# Patient Record
Sex: Female | Born: 1960 | Race: White | Hispanic: No
Health system: Southern US, Academic
[De-identification: ages and names within clinical notes are randomized; demographics above are authoritative.]

## PROBLEM LIST (undated history)

## (undated) ENCOUNTER — Ambulatory Visit

## (undated) ENCOUNTER — Ambulatory Visit: Attending: Pharmacist | Primary: Pharmacist

## (undated) ENCOUNTER — Encounter: Attending: Internal Medicine | Primary: Internal Medicine

## (undated) ENCOUNTER — Encounter
Payer: MEDICAID | Attending: Student in an Organized Health Care Education/Training Program | Primary: Student in an Organized Health Care Education/Training Program

## (undated) ENCOUNTER — Encounter

## (undated) ENCOUNTER — Inpatient Hospital Stay

## (undated) ENCOUNTER — Ambulatory Visit: Payer: MEDICAID

## (undated) ENCOUNTER — Encounter
Attending: Pharmacist Clinician (PhC)/ Clinical Pharmacy Specialist | Primary: Pharmacist Clinician (PhC)/ Clinical Pharmacy Specialist

## (undated) ENCOUNTER — Telehealth
Attending: Student in an Organized Health Care Education/Training Program | Primary: Student in an Organized Health Care Education/Training Program

## (undated) ENCOUNTER — Telehealth

## (undated) ENCOUNTER — Encounter
Attending: Student in an Organized Health Care Education/Training Program | Primary: Student in an Organized Health Care Education/Training Program

## (undated) ENCOUNTER — Telehealth
Attending: Pharmacist Clinician (PhC)/ Clinical Pharmacy Specialist | Primary: Pharmacist Clinician (PhC)/ Clinical Pharmacy Specialist

## (undated) ENCOUNTER — Telehealth: Attending: Internal Medicine | Primary: Internal Medicine

## (undated) ENCOUNTER — Ambulatory Visit: Attending: Internal Medicine | Primary: Internal Medicine

## (undated) ENCOUNTER — Non-Acute Institutional Stay: Payer: MEDICAID

## (undated) ENCOUNTER — Encounter: Payer: MEDICAID | Attending: Internal Medicine | Primary: Internal Medicine

## (undated) ENCOUNTER — Encounter: Payer: MEDICAID | Attending: Hematology | Primary: Hematology

## (undated) DIAGNOSIS — B1910 Unspecified viral hepatitis B without hepatic coma: Secondary | ICD-10-CM

## (undated) HISTORY — PX: TUBAL LIGATION: SHX77

---

## 2014-03-31 ENCOUNTER — Emergency Department: Payer: Self-pay | Admitting: Emergency Medicine

## 2014-03-31 LAB — CBC
HCT: 47.5 % — ABNORMAL HIGH (ref 35.0–47.0)
HGB: 16.1 g/dL — ABNORMAL HIGH (ref 12.0–16.0)
MCH: 31.6 pg (ref 26.0–34.0)
MCHC: 34 g/dL (ref 32.0–36.0)
MCV: 93 fL (ref 80–100)
PLATELETS: 231 10*3/uL (ref 150–440)
RBC: 5.11 10*6/uL (ref 3.80–5.20)
RDW: 13.4 % (ref 11.5–14.5)
WBC: 15.1 10*3/uL — AB (ref 3.6–11.0)

## 2014-03-31 LAB — COMPREHENSIVE METABOLIC PANEL
ALBUMIN: 3.8 g/dL (ref 3.4–5.0)
ALK PHOS: 157 U/L — AB
Anion Gap: 4 — ABNORMAL LOW (ref 7–16)
BUN: 7 mg/dL (ref 7–18)
Bilirubin,Total: 0.3 mg/dL (ref 0.2–1.0)
CHLORIDE: 108 mmol/L — AB (ref 98–107)
CO2: 30 mmol/L (ref 21–32)
Calcium, Total: 8.6 mg/dL (ref 8.5–10.1)
Creatinine: 0.75 mg/dL (ref 0.60–1.30)
EGFR (African American): >60
EGFR (Non-African Amer.): >60
Glucose: 190 mg/dL — ABNORMAL HIGH (ref 65–99)
Osmolality: 286 (ref 275–301)
Potassium: 3.8 mmol/L (ref 3.5–5.1)
SGOT(AST): 111 U/L — ABNORMAL HIGH (ref 15–37)
SGPT (ALT): 144 U/L — ABNORMAL HIGH (ref 12–78)
SODIUM: 142 mmol/L (ref 136–145)
Total Protein: 9 g/dL — ABNORMAL HIGH (ref 6.4–8.2)

## 2014-03-31 LAB — URINALYSIS, COMPLETE
Bilirubin,UR: NEGATIVE
Glucose,UR: NEGATIVE mg/dL (ref 0–75)
KETONE: NEGATIVE
Nitrite: NEGATIVE
PH: 5 (ref 4.5–8.0)
PROTEIN: NEGATIVE
SPECIFIC GRAVITY: 1.006 (ref 1.003–1.030)
Squamous Epithelial: 5

## 2014-03-31 LAB — DRUG SCREEN, URINE
Amphetamines, Ur Screen: NEGATIVE (ref ?–1000)
BENZODIAZEPINE, UR SCRN: NEGATIVE (ref ?–200)
Barbiturates, Ur Screen: NEGATIVE (ref ?–200)
CANNABINOID 50 NG, UR ~~LOC~~: NEGATIVE (ref ?–50)
COCAINE METABOLITE, UR ~~LOC~~: NEGATIVE (ref ?–300)
MDMA (Ecstasy)Ur Screen: NEGATIVE (ref ?–500)
Methadone, Ur Screen: NEGATIVE (ref ?–300)
Opiate, Ur Screen: NEGATIVE (ref ?–300)
Phencyclidine (PCP) Ur S: NEGATIVE (ref ?–25)
Tricyclic, Ur Screen: NEGATIVE (ref ?–1000)

## 2014-03-31 LAB — ETHANOL
ETHANOL LVL: 239 mg/dL
Ethanol %: 0.239 % — ABNORMAL HIGH (ref 0.000–0.080)

## 2014-03-31 LAB — SALICYLATE LEVEL: Salicylates, Serum: 3 mg/dL — ABNORMAL HIGH

## 2014-03-31 LAB — ACETAMINOPHEN LEVEL

## 2014-04-01 LAB — URINE CULTURE

## 2015-01-06 ENCOUNTER — Emergency Department: Payer: Self-pay | Admitting: Internal Medicine

## 2015-04-02 NOTE — Consult Note (Signed)
PATIENT NAME:  Sherry Bradley, Sherry Bradley MR#:  [id number] DATE OF BIRTH:  01-15-1961  DATE OF CONSULTATION:  03/31/2014  REFERRING PHYSICIAN:   CONSULTING PHYSICIAN:  Audery Amel, MD  IDENTIFYING INFORMATION AND REASON FOR CONSULT: A 54 year old woman brought to the Emergency Room under involuntary petition because of behavior while intoxicated. Consultation for psychiatric disposition.   HISTORY OF PRESENT ILLNESS: Information obtained from the patient and the chart. The commitment petition states that the patient was intoxicated and was talking crazy, fighting with her family members, walking out into the street, talking about how she wanted a car to hit her yesterday. It states that the patient has a long history of substance abuse problems including alcohol and cocaine. It also mentions that she is all right as long as she is not drinking. The patient herself basically says the same thing. She says that she had not been drinking much at all in the last year or so, but that yesterday her niece came to visit and wanted to drink some whiskey, so she obliged her. She admits that she got intoxicated and says that she thinks it has perfectly natural for her to talk about killing herself and act crazy while she is intoxicated. She denies that she actually has any intention of wanting to die. She denies that her mood has been particularly depressed. Denies hopelessness. Endorses positive thoughts about her life and about her future plans. Denies any hallucinations. She denies that she has been abusing any other drugs. Says he has not used any crack cocaine in at least a year.   PAST PSYCHIATRIC HISTORY: She has never been at our facility before, but says that when she lived in [place] she did have a history of a drug abuse problem. She had been hospitalized at times for substance abuse detox. She denies that she has had a history of seizures or delirium tremens. She admits that she used to use crack cocaine, but says  that she has got that under control. She admits that she has cut herself on the arm and did that about 6 months ago, but denies any self-harm since then. Denies any other suicide attempts. She says she has never been hospitalized for anything besides substance abuse and cannot recall ever being prescribed any psychiatric medicine.   SOCIAL HISTORY: Moved up here from [place] a year or two ago and lives with extended family. Takes care of grandchildren at home. Says that when she is drinking she will sometimes get into fights with her family. It does not seem to feel like it is a major problem when she is sober.   PAST MEDICAL HISTORY: Says that she has heartburn and acid reflux, but denies any other significant ongoing medical problems. Denies any history of seizures.   FAMILY HISTORY: The patient states there is a family history of substance abuse.   REVIEW OF SYSTEMS: Currently she states that she has a headache and feels tired. She denies feeling sad and depressed. Denies feeling hopeless. Denies any suicidal thought or wish to die. Denies homicidal ideation. Denies auditory or visual or tactile hallucinations. She denies pulmonary or cardiac or GI problems right now other than that she is not hungry. The rest of the full review of systems is negative.   MENTAL STATUS EXAMINATION: Overweight woman, looks her stated age, interviewed in the Emergency Room. She was lying down on stretcher with her arm over her face. She was passively cooperative with the interview. Made little eye contact. She explained  this because she had a headache. Speech was decreased in total amount, but understandable. Affect blunted, slightly irritable. Mood stated as being fine. Thoughts are lucid with no evidence of delusions or loosening of associations. Denies auditory or visual hallucinations. Denies suicidal or homicidal ideation. The patient could recall 3 out of 3 objects immediately and at 3 minutes. She was alert and  oriented x 4. She had appropriate understanding of her history of a substance abuse problem.   LABORATORY RESULTS: Chemistry panel shows a negative urine drug screen. Urinalysis does look like she probably has a urinary tract infection. White count elevated at 15, hematocrit elevated at 47.5, platelet count normal. ALT elevated at 144, AST elevated 199, total protein elevated at 9, blood glucose was 190. Alcohol level was 239 as of 2:00 in the morning today. Salicylates only slightly elevated at 3. Acetaminophen negative.   VITAL SIGNS: Current blood pressure 98/53, respirations 18, pulse 77, temperature 98. She did not appear to be having any kind of tremor when I saw her.   ASSESSMENT: This is a 54 year old woman with alcohol dependence and cocaine dependence, recent relapse into alcohol. Currently having minimal or no signs or symptoms of alcohol withdrawal. While intoxicated, made statements about suicidality, but did not actually harm herself. Now that she is sober, she completely denies any suicidal ideation and denies any homicidal ideation. She does not appear to be psychotic or delirious. The patient may or may not need physically monitored detox, but at this point, it seems likely that she could benefit from it, but it is probably not life or death.   TREATMENT PLAN: The patient was educated about alcohol abuse. She was reminded that heavy drinking causes people to do or say things that they may regret. She expresses a complete understanding of this and says that she regrets having drunk whiskey yesterday and intends to go back to not drinking again. Currently, she does not appear to be psychotic and does not appear to be expressing acute dangerousness to herself.   I suggest that we refer her to the alcohol and drug abuse treatment center in [place]. If however, there is no bed available, we will have to reconsider. I do not think she needs psychiatric hospitalization at our facility.    DIAGNOSIS, PRINCIPAL AND PRIMARY:  AXIS I: Alcohol dependence.  SECONDARY DIAGNOSES:  AXIS I:  1.  Cocaine dependence, in remission.  2.  Substance-induced mood disorder, resolved.  AXIS II: Deferred.  AXIS III: Gastric reflux symptoms.  AXIS IV: Moderate from conflict with her family.  AXIS V: Functioning at time of evaluation is 47.  ____________________________ Audery AmelJohn T. Mahlani Berninger, MD jtc:aw D: 03/31/2014 13:03:06 ET T: 03/31/2014 13:11:37 ET JOB#: [id number]  cc: Audery AmelJohn T. Wrenna Saks, MD, <Dictator> Audery AmelJOHN T Raymar Joiner MD ELECTRONICALLY SIGNED 03/31/2014 14:00

## 2015-07-11 ENCOUNTER — Encounter: Payer: Self-pay | Admitting: Emergency Medicine

## 2015-07-11 ENCOUNTER — Emergency Department
Admission: EM | Admit: 2015-07-11 | Discharge: 2015-07-11 | Disposition: A | Discharge status: Home / Self Care | Payer: Self-pay | Attending: Emergency Medicine | Admitting: Emergency Medicine

## 2015-07-11 ENCOUNTER — Emergency Department: Payer: Self-pay

## 2015-07-11 DIAGNOSIS — Y998 Other external cause status: Secondary | ICD-10-CM | POA: Insufficient documentation

## 2015-07-11 DIAGNOSIS — Z720 Tobacco use: Secondary | ICD-10-CM | POA: Insufficient documentation

## 2015-07-11 DIAGNOSIS — W182XXA Fall in (into) shower or empty bathtub, initial encounter: Secondary | ICD-10-CM | POA: Insufficient documentation

## 2015-07-11 DIAGNOSIS — Y9389 Activity, other specified: Secondary | ICD-10-CM | POA: Insufficient documentation

## 2015-07-11 DIAGNOSIS — Y92091 Bathroom in other non-institutional residence as the place of occurrence of the external cause: Secondary | ICD-10-CM | POA: Insufficient documentation

## 2015-07-11 DIAGNOSIS — S52502A Unspecified fracture of the lower end of left radius, initial encounter for closed fracture: Secondary | ICD-10-CM | POA: Insufficient documentation

## 2015-07-11 MED ORDER — OXYCODONE-ACETAMINOPHEN 5-325 MG PO TABS: 1.0000 | ORAL_TABLET | Freq: Four times a day (QID) | ORAL | Status: DC | PRN

## 2015-07-11 MED ORDER — OXYCODONE-ACETAMINOPHEN 5-325 MG PO TABS
2.0000 | ORAL_TABLET | Freq: Once | ORAL | Status: AC
  Administered 2015-07-11: 2 via ORAL
  Filled 2015-07-11: qty 2

## 2015-07-11 NOTE — ED Provider Notes (Addendum)
[facility] Emergency Department Provider Note     Time seen: ----------------------------------------- 2:59 PM on 07/11/2015 -----------------------------------------    I have reviewed the triage vital signs and the nursing notes.   HISTORY  Chief Complaint Wrist Pain    HPI Sherry Bradley is a 54 y.o. female who presents ER stating she fell out of the shower. She reports she fell and landed on her left wrist. Patient with redness and ice pack noted to the left wrist. Patient states that when it initially occurred it was causing her a lot of pain and she had someone pulled one out. She states she felt it pop 4 times after someone pulled on it. Denies any other injuries from the fall.   History reviewed. No pertinent past medical history.  There are no active problems to display for this patient.   History reviewed. No pertinent past surgical history.  Allergies Demerol  Social History History  Substance Use Topics  . Smoking status: Current Every Day Smoker  . Smokeless tobacco: Not on file  . Alcohol Use: No    Review of Systems Constitutional: Negative for fever. Musculoskeletal: Positive for left wrist pain and deformity Skin: There is erythema noted to the dorsum of the left wrist Neurological: Negative for headaches, focal weakness or numbness.  ___________________________________________   PHYSICAL EXAM:  VITAL SIGNS: ED Triage Vitals  Enc Vitals Group     BP 07/11/15 1410 139/69 mmHg     Pulse Rate 07/11/15 1410 80     Resp 07/11/15 1410 18     Temp 07/11/15 1410 98.2 F (36.8 C)     Temp Source 07/11/15 1410 Oral     SpO2 07/11/15 1410 94 %     Weight 07/11/15 1410 223 lb (101.152 kg)     Height 07/11/15 1410  (1.549 m)     Head Cir --      Peak Flow --      Pain Score 07/11/15 1420 10     Pain Loc --      Pain Edu? --      Excl. in GC? --     Constitutional: Alert and oriented. Well appearing and in no  distress. Musculoskeletal: Pain and swelling noted to the left wrist, no gross deformities. Neurologic:  No neurologic deficits are noted in the left hand Skin:  Erythema noted to the dorsum of the left wrist ____________________________________________  ED COURSE:  Pertinent labs & imaging results that were available during my care of the patient were reviewed by me and considered in my medical decision making (see chart for details). Patient is in no acute distress, will need x-rays and reevaluation. ____________________________________________  RADIOLOGY Images were viewed by me  Distal radius fracture IMPRESSION: Comminuted impaction fracture of the distal left radius with intra-articular extension with the distal radial articular surface in neutral to slight dorsal angulation.  Fracture of the ulnar styloid. ____________________________________________  FINAL ASSESSMENT AND PLAN   acute left distal radius fracture  Plan: Patient with labs and imaging as dictated above. Velcro wrist brace is applied, she's given Percocet for pain. She will be referred to orthopedics for close follow-up.   Emily Filbert, MD   Emily Filbert, MD 07/11/15 1511  Emily Filbert, MD 07/11/15 [phone]

## 2015-07-11 NOTE — ED Notes (Signed)
Pt reports she fell out of shower, reports fell and landed on left wrist. Pt with redness and icepack to left wrist.

## 2015-07-11 NOTE — ED Notes (Signed)
Pt arrived to ED with left wrist swelling and bruising. Pt reports pain with movement and to the touch.

## 2015-07-11 NOTE — Discharge Instructions (Signed)
Radial Fracture °You have a broken bone (fracture) of the forearm. This is the part of your arm between the elbow and your wrist. Your forearm is made up of two bones. These are the radius and ulna. Your fracture is in the radial shaft. This is the bone in your forearm located on the thumb side. A cast or splint is used to protect and keep your injured bone from moving. The cast or splint will be on generally for about 5 to 6 weeks, with individual variations. °HOME CARE INSTRUCTIONS  °· Keep the injured part elevated while sitting or lying down. Keep the injury above the level of your heart (the center of the chest). This will decrease swelling and pain. °· Apply ice to the injury for 15-20 minutes, 03-04 times per day while awake, for 2 days. Put the ice in a plastic bag and place a towel between the bag of ice and your cast or splint. °· Move your fingers to avoid stiffness and minimize swelling. °· If you have a plaster or fiberglass cast: °¨ Do not try to scratch the skin under the cast using sharp or pointed objects. °¨ Check the skin around the cast every day. You may put lotion on any red or sore areas. °¨ Keep your cast dry and clean. °· If you have a plaster splint: °¨ Wear the splint as directed. °¨ You may loosen the elastic around the splint if your fingers become numb, tingle, or turn cold or blue. °¨ Do not put pressure on any part of your cast or splint. It may break. Rest your cast only on a pillow for the first 24 hours until it is fully hardened. °· Your cast or splint can be protected during bathing with a plastic bag. Do not lower the cast or splint into water. °· Only take over-the-counter or prescription medicines for pain, discomfort, or fever as directed by your caregiver. °SEEK IMMEDIATE MEDICAL CARE IF:  °· Your cast gets damaged or breaks. °· You have more severe pain or swelling than you did before getting the cast. °· You have severe pain when stretching your fingers. °· There is a bad  smell, new stains and/or pus-like (purulent) drainage coming from under the cast. °· Your fingers or hand turn pale or blue and become cold or your loose feeling. °Document Released: 05/09/2006 Document Revised: 02/18/2012 Document Reviewed: 08/05/2006 °Exit[employer]® Patient Information ©2015 [employer]. This information is not intended to replace advice given to you by your health care provider. Make sure you discuss any questions you have with your health care provider. ° °

## 2016-05-22 ENCOUNTER — Emergency Department
Admission: EM | Admit: 2016-05-22 | Discharge: 2016-05-22 | Disposition: A | Discharge status: Home / Self Care | Payer: Self-pay | Attending: Emergency Medicine | Admitting: Emergency Medicine

## 2016-05-22 ENCOUNTER — Encounter: Payer: Self-pay | Admitting: Emergency Medicine

## 2016-05-22 ENCOUNTER — Emergency Department: Payer: Self-pay

## 2016-05-22 DIAGNOSIS — Y9389 Activity, other specified: Secondary | ICD-10-CM | POA: Insufficient documentation

## 2016-05-22 DIAGNOSIS — Y999 Unspecified external cause status: Secondary | ICD-10-CM | POA: Insufficient documentation

## 2016-05-22 DIAGNOSIS — S20211A Contusion of right front wall of thorax, initial encounter: Secondary | ICD-10-CM | POA: Insufficient documentation

## 2016-05-22 DIAGNOSIS — F17200 Nicotine dependence, unspecified, uncomplicated: Secondary | ICD-10-CM | POA: Insufficient documentation

## 2016-05-22 DIAGNOSIS — W050XXA Fall from non-moving wheelchair, initial encounter: Secondary | ICD-10-CM | POA: Insufficient documentation

## 2016-05-22 DIAGNOSIS — Y929 Unspecified place or not applicable: Secondary | ICD-10-CM | POA: Insufficient documentation

## 2016-05-22 MED ORDER — OXYCODONE-ACETAMINOPHEN 5-325 MG PO TABS: 1.0000 | ORAL_TABLET | ORAL | Status: AC | PRN

## 2016-05-22 MED ORDER — IBUPROFEN 800 MG PO TABS: 800.0000 mg | ORAL_TABLET | Freq: Three times a day (TID) | ORAL | Status: AC | PRN

## 2016-05-22 NOTE — Discharge Instructions (Signed)
Cryotherapy °Cryotherapy means treatment with cold. Ice or gel packs can be used to reduce both pain and swelling. Ice is the most helpful within the first 24 to 48 hours after an injury or flare-up from overusing a muscle or joint. Sprains, strains, spasms, burning pain, shooting pain, and aches can all be eased with ice. Ice can also be used when recovering from surgery. Ice is effective, has very few side effects, and is safe for most people to use. °PRECAUTIONS  °Ice is not a safe treatment option for people with: °· Raynaud phenomenon. This is a condition affecting small blood vessels in the extremities. Exposure to cold may cause your problems to return. °· Cold hypersensitivity. There are many forms of cold hypersensitivity, including: °· Cold urticaria. Red, itchy hives appear on the skin when the tissues begin to warm after being iced. °· Cold erythema. This is a red, itchy rash caused by exposure to cold. °· Cold hemoglobinuria. Red blood cells break down when the tissues begin to warm after being iced. The hemoglobin that carry oxygen are passed into the urine because they cannot combine with blood proteins fast enough. °· Numbness or altered sensitivity in the area being iced. °If you have any of the following conditions, do not use ice until you have discussed cryotherapy with your caregiver: °· Heart conditions, such as arrhythmia, angina, or chronic heart disease. °· High blood pressure. °· Healing wounds or open skin in the area being iced. °· Current infections. °· Rheumatoid arthritis. °· Poor circulation. °· Diabetes. °Ice slows the blood flow in the region it is applied. This is beneficial when trying to stop inflamed tissues from spreading irritating chemicals to surrounding tissues. However, if you expose your skin to cold temperatures for too long or without the proper protection, you can damage your skin or nerves. Watch for signs of skin damage due to cold. °HOME CARE INSTRUCTIONS °Follow  these tips to use ice and cold packs safely. °· Place a dry or damp towel between the ice and skin. A damp towel will cool the skin more quickly, so you may need to shorten the time that the ice is used. °· For a more rapid response, add gentle compression to the ice. °· Ice for no more than 10 to 20 minutes at a time. The bonier the area you are icing, the less time it will take to get the benefits of ice. °· Check your skin after 5 minutes to make sure there are no signs of a poor response to cold or skin damage. °· Rest 20 minutes or more between uses. °· Once your skin is numb, you can end your treatment. You can test numbness by very lightly touching your skin. The touch should be so light that you do not see the skin dimple from the pressure of your fingertip. When using ice, most people will feel these normal sensations in this order: cold, burning, aching, and numbness. °· Do not use ice on someone who cannot communicate their responses to pain, such as small children or people with dementia. °HOW TO MAKE AN ICE PACK °Ice packs are the most common way to use ice therapy. Other methods include ice massage, ice baths, and cryosprays. Muscle creams that cause a cold, tingly feeling do not offer the same benefits that ice offers and should not be used as a substitute unless recommended by your caregiver. °To make an ice pack, do one of the following: °· Place crushed ice or a   bag of frozen vegetables in a sealable plastic bag. Squeeze out the excess air. Place this bag inside another plastic bag. Slide the bag into a pillowcase or place a damp towel between your skin and the bag.  Mix 3 parts water with 1 part rubbing alcohol. Freeze the mixture in a sealable plastic bag. When you remove the mixture from the freezer, it will be slushy. Squeeze out the excess air. Place this bag inside another plastic bag. Slide the bag into a pillowcase or place a damp towel between your skin and the bag. SEEK MEDICAL CARE  IF:  You develop white spots on your skin. This may give the skin a blotchy (mottled) appearance.  Your skin turns blue or pale.  Your skin becomes waxy or hard.  Your swelling gets worse. MAKE SURE YOU:   Understand these instructions.  Will watch your condition.  Will get help right away if you are not doing well or get worse.   This information is not intended to replace advice given to you by your health care provider. Make sure you discuss any questions you have with your health care provider.   Document Released: 07/23/2011 Document Revised: 12/17/2014 Document Reviewed: 07/23/2011 Elsevier Interactive Patient Education 2016 [employer]  Rib Contusion A rib contusion is a deep bruise on your rib area. Contusions are the result of a blunt trauma that causes bleeding and injury to the tissues under the skin. A rib contusion may involve bruising of the ribs and of the skin and muscles in the area. The skin overlying the contusion may turn blue, purple, or yellow. Minor injuries will give you a painless contusion, but more severe contusions may stay painful and swollen for a few weeks. CAUSES  A contusion is usually caused by a blow, trauma, or direct force to an area of the body. This often occurs while playing contact sports. SYMPTOMS  Swelling and redness of the injured area.  Discoloration of the injured area.  Tenderness and soreness of the injured area.  Pain with or without movement. DIAGNOSIS  The diagnosis can be made by taking a medical history and performing a physical exam. An X-ray, CT scan, or MRI may be needed to determine if there were any associated injuries, such as broken bones (fractures) or internal injuries. TREATMENT  Often, the best treatment for a rib contusion is rest. Icing or applying cold compresses to the injured area may help reduce swelling and inflammation. Deep breathing exercises may be recommended to reduce the risk of partial lung  collapse and pneumonia. Over-the-counter or prescription medicines may also be recommended for pain control. HOME CARE INSTRUCTIONS   Apply ice to the injured area:  Put ice in a plastic bag.  Place a towel between your skin and the bag.  Leave the ice on for 20 minutes, 2-3 times per day.  Take medicines only as directed by your health care provider.  Rest the injured area. Avoid strenuous activity and any activities or movements that cause pain. Be careful during activities and avoid bumping the injured area.  Perform deep-breathing exercises as directed by your health care provider.  Do not lift anything that is heavier than 5 lb (2.3 kg) until your health care provider approves.  Do not use any tobacco products, including cigarettes, chewing tobacco, or electronic cigarettes. If you need help quitting, ask your health care provider. SEEK MEDICAL CARE IF:   You have increased bruising or swelling.  You have pain that is  not controlled with treatment.  You have a fever. SEEK IMMEDIATE MEDICAL CARE IF:   You have difficulty breathing or shortness of breath.  You develop a continual cough, or you cough up thick or bloody sputum.  You feel sick to your stomach (nauseous), you throw up (vomit), or you have abdominal pain.   This information is not intended to replace advice given to you by your health care provider. Make sure you discuss any questions you have with your health care provider.   Document Released: 08/21/2001 Document Revised: 12/17/2014 Document Reviewed: 09/07/2014 Elsevier Interactive Patient Education [employer]

## 2016-05-22 NOTE — ED Provider Notes (Signed)
[facility] Emergency Department Provider Note  ____________________________________________  Time seen: Approximately 12:46 PM  I have reviewed the triage vital signs and the nursing notes.   HISTORY  Chief Complaint Fall    HPI Sherry Bradley is a 55 y.o. female is here for evaluation after falling out of a wheelchair and landing on her right ribs. Patient is complaining of right lateral and posterior rib pain. Denies any difficulty breathing denies any shortness of breath.   History reviewed. No pertinent past medical history.  There are no active problems to display for this patient.   History reviewed. No pertinent past surgical history.  Current Outpatient Rx  Name  Route  Sig  Dispense  Refill  . ibuprofen (ADVIL,MOTRIN) 800 MG tablet   Oral   Take 1 tablet (800 mg total) by mouth every 8 (eight) hours as needed.   30 tablet   0   . oxyCODONE-acetaminophen (ROXICET) 5-325 MG tablet   Oral   Take 1-2 tablets by mouth every 4 (four) hours as needed for severe pain.   15 tablet   0     Allergies Demerol  No family history on file.  Social History Social History  Substance Use Topics  . Smoking status: Current Every Day Smoker  . Smokeless tobacco: None  . Alcohol Use: No    Review of Systems Constitutional: No fever/chills Cardiovascular: Denies chest pain. Respiratory: Denies shortness of breath. Gastrointestinal: No abdominal pain.  No nausea, no vomiting.  No diarrhea.  No constipation. Musculoskeletal: Positive for right rib pain. Skin: Negative for rash. Neurological: Negative for headaches, focal weakness or numbness.  10-point ROS otherwise negative.  ____________________________________________   PHYSICAL EXAM:  VITAL SIGNS: ED Triage Vitals  Enc Vitals Group     BP 05/22/16 1236 144/68 mmHg     Pulse Rate 05/22/16 1236 93     Resp 05/22/16 1236 16     Temp 05/22/16 1236 98.2 F (36.8 C)     Temp Source  05/22/16 1236 Oral     SpO2 05/22/16 1236 95 %     Weight 05/22/16 1236 184 lb (83.462 kg)     Height 05/22/16 1236  (1.575 m)     Head Cir --      Peak Flow --      Pain Score 05/22/16 1235 10     Pain Loc --      Pain Edu? --      Excl. in GC? --     Constitutional: Alert and oriented. Well appearing and in no acute distress. Cardiovascular: Normal rate, regular rhythm. Grossly normal heart sounds.  Good peripheral circulation. Respiratory: Normal respiratory effort.  No retractions. Lungs CTAB. Gastrointestinal: Soft and nontender. No distention. No abdominal bruits. No CVA tenderness. Musculoskeletal: No lower extremity tenderness nor edema.  No joint effusions. Right ribs and point tenderness no ecchymosis or bruising noted. Neurologic:  Normal speech and language. No gross focal neurologic deficits are appreciated. No gait instability. Skin:  Skin is warm, dry and intact. No rash noted. Psychiatric: Mood and affect are normal. Speech and behavior are normal.  ____________________________________________   LABS (all labs ordered are listed, but only abnormal results are displayed)  Labs Reviewed - No data to display ____________________________________________  EKG   ____________________________________________  RADIOLOGY   ____________________________________________   PROCEDURES  Procedure(s) performed: None  Critical Care performed: No  ____________________________________________   INITIAL IMPRESSION / ASSESSMENT AND PLAN / ED COURSE  Pertinent labs &  imaging results that were available during my care of the patient were reviewed by me and considered in my medical decision making (see chart for details).  Acute right rib contusion. Rx given for ibuprofen 800 mg 3 times a day and Percocet 5/325. She is to follow-up with PCP or return to ER with worsening symptomology. [place] controlled substance reporting system reviewed prior to  administration of medications. ____________________________________________   FINAL CLINICAL IMPRESSION(S) / ED DIAGNOSES  Final diagnoses:  Rib contusion, right, initial encounter     This chart was dictated using voice recognition software/Dragon. Despite best efforts to proofread, errors can occur which can change the meaning. Any change was purely unintentional.   Evangeline Dakinharles M Beers, PA-C 05/22/16 1343  Rockne MenghiniAnne-Caroline Norman, MD 05/22/16 1423

## 2016-05-22 NOTE — ED Notes (Signed)
After swimming yesterday, when to get out of the pool and sit in chair, chair toppled and patient fell.  C/o right posterior rib pain.

## 2016-06-17 ENCOUNTER — Emergency Department
Admission: EM | Admit: 2016-06-17 | Discharge: 2016-06-18 | Disposition: A | Discharge status: Home / Self Care | Payer: Self-pay | Attending: Emergency Medicine | Admitting: Emergency Medicine

## 2016-06-17 ENCOUNTER — Encounter: Payer: Self-pay | Admitting: *Deleted

## 2016-06-17 ENCOUNTER — Emergency Department: Payer: Self-pay

## 2016-06-17 DIAGNOSIS — J40 Bronchitis, not specified as acute or chronic: Secondary | ICD-10-CM | POA: Insufficient documentation

## 2016-06-17 DIAGNOSIS — R0789 Other chest pain: Secondary | ICD-10-CM

## 2016-06-17 DIAGNOSIS — R0600 Dyspnea, unspecified: Secondary | ICD-10-CM

## 2016-06-17 DIAGNOSIS — F17210 Nicotine dependence, cigarettes, uncomplicated: Secondary | ICD-10-CM | POA: Insufficient documentation

## 2016-06-17 HISTORY — DX: Unspecified viral hepatitis B without hepatic coma: B19.10

## 2016-06-17 LAB — BASIC METABOLIC PANEL
Anion gap: 7 (ref 5–15)
BUN: 10 mg/dL (ref 6–20)
CHLORIDE: 101 mmol/L (ref 101–111)
CO2: 29 mmol/L (ref 22–32)
Calcium: 9.4 mg/dL (ref 8.9–10.3)
Creatinine, Ser: 0.61 mg/dL (ref 0.44–1.00)
GFR calc Af Amer: >60 mL/min (ref >60)
GFR calc non Af Amer: >60 mL/min (ref >60)
Glucose, Bld: 164 mg/dL — ABNORMAL HIGH (ref 65–99)
POTASSIUM: 3.8 mmol/L (ref 3.5–5.1)
SODIUM: 137 mmol/L (ref 135–145)

## 2016-06-17 LAB — CBC
HEMATOCRIT: 48.3 % — AB (ref 35.0–47.0)
Hemoglobin: 16.8 g/dL — ABNORMAL HIGH (ref 12.0–16.0)
MCH: 31.6 pg (ref 26.0–34.0)
MCHC: 34.8 g/dL (ref 32.0–36.0)
MCV: 90.9 fL (ref 80.0–100.0)
Platelets: 121 10*3/uL — ABNORMAL LOW (ref 150–440)
RBC: 5.32 MIL/uL — AB (ref 3.80–5.20)
RDW: 13.9 % (ref 11.5–14.5)
WBC: 12.8 10*3/uL — AB (ref 3.6–11.0)

## 2016-06-17 LAB — TROPONIN I: Troponin I: <0.03 ng/mL (ref <0.03)

## 2016-06-17 NOTE — ED Notes (Signed)
Patient with right rib pain. States coughing and felt something pop while she was coughing. States she injured herself a month ago and today it seems worse.

## 2016-06-18 ENCOUNTER — Emergency Department: Payer: Self-pay

## 2016-06-18 LAB — FIBRIN DERIVATIVES D-DIMER (ARMC ONLY): Fibrin derivatives D-dimer (ARMC): 512 — ABNORMAL HIGH (ref 0–499)

## 2016-06-18 LAB — TROPONIN I: Troponin I: <0.03 ng/mL (ref <0.03)

## 2016-06-18 MED ORDER — PREDNISONE 20 MG PO TABS: 60.0000 mg | ORAL_TABLET | Freq: Every day | ORAL | Status: AC

## 2016-06-18 MED ORDER — TRAMADOL HCL 50 MG PO TABS: 50.0000 mg | ORAL_TABLET | Freq: Four times a day (QID) | ORAL | Status: AC | PRN

## 2016-06-18 MED ORDER — ALBUTEROL SULFATE HFA 108 (90 BASE) MCG/ACT IN AERS: 2.0000 | INHALATION_SPRAY | Freq: Four times a day (QID) | RESPIRATORY_TRACT | Status: AC | PRN

## 2016-06-18 MED ORDER — LIDOCAINE 5 % EX PTCH
1.0000 | MEDICATED_PATCH | CUTANEOUS | Status: DC
  Administered 2016-06-18: 1 via TRANSDERMAL
  Filled 2016-06-18: qty 1

## 2016-06-18 MED ORDER — AZITHROMYCIN 250 MG PO TABS: ORAL_TABLET | ORAL | Status: AC

## 2016-06-18 MED ORDER — IOPAMIDOL (ISOVUE-370) INJECTION 76%
75.0000 mL | Freq: Once | INTRAVENOUS | Status: AC | PRN
  Administered 2016-06-18: 75 mL via INTRAVENOUS

## 2016-06-18 MED ORDER — KETOROLAC TROMETHAMINE 60 MG/2ML IM SOLN
60.0000 mg | Freq: Once | INTRAMUSCULAR | Status: AC
  Administered 2016-06-18: 60 mg via INTRAMUSCULAR
  Filled 2016-06-18: qty 2

## 2016-06-18 MED ORDER — IPRATROPIUM-ALBUTEROL 0.5-2.5 (3) MG/3ML IN SOLN
3.0000 mL | Freq: Once | RESPIRATORY_TRACT | Status: AC
  Administered 2016-06-18: 3 mL via RESPIRATORY_TRACT
  Filled 2016-06-18: qty 3

## 2016-06-18 MED ORDER — LIDOCAINE 5 % EX PTCH: 1.0000 | MEDICATED_PATCH | Freq: Two times a day (BID) | CUTANEOUS | Status: AC

## 2016-06-18 MED ORDER — PREDNISONE 20 MG PO TABS
60.0000 mg | ORAL_TABLET | Freq: Once | ORAL | Status: AC
  Administered 2016-06-18: 60 mg via ORAL
  Filled 2016-06-18: qty 3

## 2016-06-18 NOTE — ED Provider Notes (Signed)
[facility] Emergency Department Provider Note   ____________________________________________  Time seen: Approximately 0001 AM  I have reviewed the triage vital signs and the nursing notes.   HISTORY  Chief Complaint Rib Injury    HPI Sherry Bradley is a 55 y.o. female who comes into the hospital today with some right-sided chest pain. The patient reports that she injured herself falling last month and since then developed a chest cold. She reports that she's been coughing and sneezing and 2 days ago felt something pop in the right side of her chest. The patient reports that she was told she had a contusion from her fall previously but has been coughing up some green. She also reports that it hurts when she breathes. The patient does not have a primary care physician but reports her pain as a 10 out of 10 in intensity. The patient is been taking ibuprofen and BC powders but she reports is not helping. She last took any ibuprofen 3 days ago. She reports that she is feeling short of breath than she had Percocet but it makes her sick. The patient is here for evaluation.   Past Medical History  Diagnosis Date  . 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   Past Surgical History  Procedure Laterality Date  . Tubal ligation      Current Outpatient Rx  Name  Route  Sig  Dispense  Refill  . albuterol (PROVENTIL HFA;VENTOLIN HFA) 108 (90 Base) MCG/ACT inhaler   Inhalation   Inhale 2 puffs into the lungs every 6 (six) hours as needed.   1 Inhaler   0   . azithromycin (ZITHROMAX Z-PAK) 250 MG tablet      Take 2 tablets (500 mg) on  Day 1,  followed by 1 tablet (250 mg) once daily on Days 2 through 5.   6 each   0   . ibuprofen (ADVIL,MOTRIN) 800 MG tablet   Oral   Take 1 tablet (800 mg total) by mouth every 8 (eight) hours as needed.   30 tablet   0   . lidocaine (LIDODERM) 5 %   Transdermal   Place 1 patch onto  the skin every 12 (twelve) hours. Remove & Discard patch within 12 hours or as directed by MD   10 patch   0   . oxyCODONE-acetaminophen (ROXICET) 5-325 MG tablet   Oral   Take 1-2 tablets by mouth every 4 (four) hours as needed for severe pain.   15 tablet   0   . predniSONE (DELTASONE) 20 MG tablet   Oral   Take 3 tablets (60 mg total) by mouth daily.   12 tablet   0   . traMADol (ULTRAM) 50 MG tablet   Oral   Take 1 tablet (50 mg total) by mouth every 6 (six) hours as needed.   12 tablet   0     Allergies Demerol  History reviewed. No pertinent family history.  Social History Social History  Substance Use Topics  . Smoking status: Current Every Day Smoker    Types: Cigarettes  . Smokeless tobacco: Never Used  . Alcohol Use: Yes     Comment: rarely    Review of Systems Constitutional: No fever/chills Eyes: No visual changes. ENT: No sore throat. Cardiovascular:Right-sided chest pain. Respiratory:  shortness of breath. Gastrointestinal: No abdominal pain.  No nausea, no vomiting.  No diarrhea.  No  constipation. Genitourinary: Negative for dysuria. Musculoskeletal: Negative for back pain. Skin: Negative for rash. Neurological: Negative for headaches, focal weakness or numbness.  10-point ROS otherwise negative.  ____________________________________________   PHYSICAL EXAM:  VITAL SIGNS: ED Triage Vitals  Enc Vitals Group     BP 06/17/16 2247 150/88 mmHg     Pulse Rate 06/17/16 2247 94     Resp 06/17/16 2247 24     Temp 06/17/16 2247 98.2 F (36.8 C)     Temp Source 06/17/16 2247 Oral     SpO2 06/17/16 2247 96 %     Weight 06/17/16 2245 184 lb (83.462 kg)     Height 06/17/16 2245 5\' 1"  (1.549 m)     Head Cir --      Peak Flow --      Pain Score 06/17/16 2248 10     Pain Loc --      Pain Edu? --      Excl. in GC? --     Constitutional: Alert and oriented. Well appearing and in Mild distress. Eyes: Conjunctivae are normal. PERRL.  EOMI. Head: Atraumatic. Nose: No congestion/rhinnorhea. Mouth/Throat: Mucous membranes are moist.  Oropharynx non-erythematous. Cardiovascular: Normal rate, regular rhythm. Grossly normal heart sounds.  Good peripheral circulation. Respiratory: Normal respiratory effort.  No retractions. Expiratory wheezing in lung fields, right sided chest tenderness to palpation especially under her right breast Gastrointestinal: Soft and nontender. No distention. Positive bowel sounds Musculoskeletal: No lower extremity tenderness nor edema.   Neurologic:  Normal speech and language.  Skin:  Skin is warm, dry and intact.  Psychiatric: Mood and affect are normal.   ____________________________________________   LABS (all labs ordered are listed, but only abnormal results are displayed)  Labs Reviewed  BASIC METABOLIC PANEL - Abnormal; Notable for the following:    Glucose, Bld 164 (*)    All other components within normal limits  CBC - Abnormal; Notable for the following:    WBC 12.8 (*)    RBC 5.32 (*)    Hemoglobin 16.8 (*)    HCT 48.3 (*)    Platelets 121 (*)    All other components within normal limits  FIBRIN DERIVATIVES D-DIMER ([facility] ONLY) - Abnormal; Notable for the following:    Fibrin derivatives D-dimer ([facility]) 512 (*)    All other components within normal limits  TROPONIN I  TROPONIN I   ____________________________________________  EKG  ED ECG REPORT I, Rebecka ApleyWebster,  Keene Gilkey P, the attending physician, personally viewed and interpreted this ECG.   Date: 06/17/2016  EKG Time: 2300  Rate: 98  Rhythm: normal sinus rhythm  Axis: Normal  Intervals:none  ST&T Change: None  ____________________________________________  RADIOLOGY  Chest x-ray: No active cardiopulmonary disease  CT angio chest: No CT evidence of pulmonary embolism ____________________________________________   PROCEDURES  Procedure(s) performed: None  Procedures  Critical Care performed:  No  ____________________________________________   INITIAL IMPRESSION / ASSESSMENT AND PLAN / ED COURSE  Pertinent labs & imaging results that were available during my care of the patient were reviewed by me and considered in my medical decision making (see chart for details).  This is a 55 year old female who comes into the hospital today with some right-sided chest pain. The patient reports that she was coughing and felt something pop. I did give the patient a dose of Toradol as well as a Lidoderm patch to her chest. The patient also did receive 2 DuoNeb's as well as some prednisone. I feel that the patient does  have some bronchitis and I will treat her with a Z-Pak and steroids. The patient will be discharged home to follow-up with the acute care clinic. ____________________________________________   FINAL CLINICAL IMPRESSION(S) / ED DIAGNOSES  Final diagnoses:  Chest wall pain  Bronchitis  Dyspnea      NEW MEDICATIONS STARTED DURING THIS VISIT:  Discharge Medication List as of 06/18/2016  3:58 AM    START taking these medications   Details  albuterol (PROVENTIL HFA;VENTOLIN HFA) 108 (90 Base) MCG/ACT inhaler Inhale 2 puffs into the lungs every 6 (six) hours as needed., Starting 06/18/2016, Until Discontinued, Print    azithromycin (ZITHROMAX Z-PAK) 250 MG tablet Take 2 tablets (500 mg) on  Day 1,  followed by 1 tablet (250 mg) once daily on Days 2 through 5., Print    lidocaine (LIDODERM) 5 % Place 1 patch onto the skin every 12 (twelve) hours. Remove & Discard patch within 12 hours or as directed by MD, Starting 06/18/2016, Until Tue 06/18/17, Print    predniSONE (DELTASONE) 20 MG tablet Take 3 tablets (60 mg total) by mouth daily., Starting 06/18/2016, Until Discontinued, Print    traMADol (ULTRAM) 50 MG tablet Take 1 tablet (50 mg total) by mouth every 6 (six) hours as needed., Starting 06/18/2016, Until Discontinued, Print         Note:  This document was prepared using  Dragon voice recognition software and may include unintentional dictation errors.    Rebecka Apley, MD 06/18/16 [phone]

## 2016-06-18 NOTE — Discharge Instructions (Signed)
Chest Wall Pain Chest wall pain is pain in or around the bones and muscles of your chest. Sometimes, an injury causes this pain. Sometimes, the cause may not be known. This pain may take several weeks or longer to get better. HOME CARE INSTRUCTIONS  Pay attention to any changes in your symptoms. Take these actions to help with your pain:   Rest as told by your health care provider.   Avoid activities that cause pain. These include any activities that use your chest muscles or your abdominal and side muscles to lift heavy items.   If directed, apply ice to the painful area:  Put ice in a plastic bag.  Place a towel between your skin and the bag.  Leave the ice on for 20 minutes, 2-3 times per day.  Take over-the-counter and prescription medicines only as told by your health care provider.  Do not use tobacco products, including cigarettes, chewing tobacco, and e-cigarettes. If you need help quitting, ask your health care provider.  Keep all follow-up visits as told by your health care provider. This is important. SEEK MEDICAL CARE IF:  You have a fever.  Your chest pain becomes worse.  You have new symptoms. SEEK IMMEDIATE MEDICAL CARE IF:  You have nausea or vomiting.  You feel sweaty or light-headed.  You have a cough with phlegm (sputum) or you cough up blood.  You develop shortness of breath.   This information is not intended to replace advice given to you by your health care provider. Make sure you discuss any questions you have with your health care provider.   Document Released: 11/26/2005 Document Revised: 08/17/2015 Document Reviewed: 02/21/2015 Elsevier Interactive Patient Education 2016 [employer].  Shortness of Breath Shortness of breath means you have trouble breathing. Shortness of breath needs medical care right away. HOME CARE   Do not smoke.  Avoid being around chemicals or things (paint fumes, dust) that may bother your breathing.  Rest as  needed. Slowly begin your normal activities.  Only take medicines as told by your doctor.  Keep all doctor visits as told. GET HELP RIGHT AWAY IF:   Your shortness of breath gets worse.  You feel lightheaded, pass out (faint), or have a cough that is not helped by medicine.  You cough up blood.  You have pain with breathing.  You have pain in your chest, arms, shoulders, or belly (abdomen).  You have a fever.  You cannot walk up stairs or exercise the way you normally do.  You do not get better in the time expected.  You have a hard time doing normal activities even with rest.  You have problems with your medicines.  You have any new symptoms. MAKE SURE YOU:  Understand these instructions.  Will watch your condition.  Will get help right away if you are not doing well or get worse.   This information is not intended to replace advice given to you by your health care provider. Make sure you discuss any questions you have with your health care provider.   Document Released: 05/14/2008 Document Revised: 12/01/2013 Document Reviewed: 02/11/2012 Elsevier Interactive Patient Education 2016 Elsevier Inc.  Upper Respiratory Infection, Adult Most upper respiratory infections (URIs) are caused by a virus. A URI affects the nose, throat, and upper air passages. The most common type of URI is often called "the common cold." HOME CARE   Take medicines only as told by your doctor.  Gargle warm saltwater or take cough drops  to comfort your throat as told by your doctor.  Use a warm mist humidifier or inhale steam from a shower to increase air moisture. This may make it easier to breathe.  Drink enough fluid to keep your pee (urine) clear or pale yellow.  Eat soups and other clear broths.  Have a healthy diet.  Rest as needed.  Go back to work when your fever is gone or your doctor says it is okay.  You may need to stay home longer to avoid giving your URI to  others.  You can also wear a face mask and wash your hands often to prevent spread of the virus.  Use your inhaler more if you have asthma.  Do not use any tobacco products, including cigarettes, chewing tobacco, or electronic cigarettes. If you need help quitting, ask your doctor. GET HELP IF:  You are getting worse, not better.  Your symptoms are not helped by medicine.  You have chills.  You are getting more short of breath.  You have brown or red mucus.  You have yellow or brown discharge from your nose.  You have pain in your face, especially when you bend forward.  You have a fever.  You have puffy (swollen) neck glands.  You have pain while swallowing.  You have white areas in the back of your throat. GET HELP RIGHT AWAY IF:   You have very bad or constant:  Headache.  Ear pain.  Pain in your forehead, behind your eyes, and over your cheekbones (sinus pain).  Chest pain.  You have long-lasting (chronic) lung disease and any of the following:  Wheezing.  Long-lasting cough.  Coughing up blood.  A change in your usual mucus.  You have a stiff neck.  You have changes in your:  Vision.  Hearing.  Thinking.  Mood. MAKE SURE YOU:   Understand these instructions.  Will watch your condition.  Will get help right away if you are not doing well or get worse.   This information is not intended to replace advice given to you by your health care provider. Make sure you discuss any questions you have with your health care provider.   Document Released: 05/14/2008 Document Revised: 04/12/2015 Document Reviewed: 03/03/2014 Elsevier Interactive Patient Education [employer]2016 Elsevier Inc.

## 2017-12-19 IMAGING — CT CT ANGIO CHEST
2 of 6 series · 18 of 46 positions shown · IV contrast (APPLIED)
Comparison: Chest radiograph dated 06/17/2016

CLINICAL DATA: 54-year-old female with chest pain

EXAM:
CT ANGIOGRAPHY CHEST WITH CONTRAST
TECHNIQUE: Multidetector CT imaging of the chest was performed using the
standard protocol during bolus administration of intravenous
contrast. Multiplanar CT image reconstructions and MIPs were
obtained to evaluate the vascular anatomy.
CONTRAST:  75 cc Isovue 370

[Series 5: pe thins 1.5 · axial · 0.68mm/px · z∈[-317,-72]mm · 15 of 228 slices shown]
[im 12/228  lung]
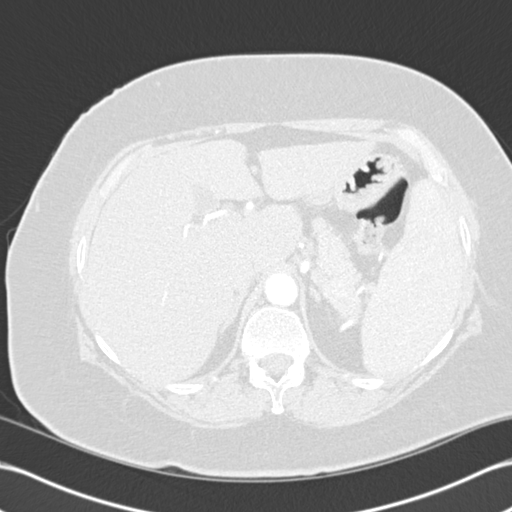
[im 24/228  soft-tissue]
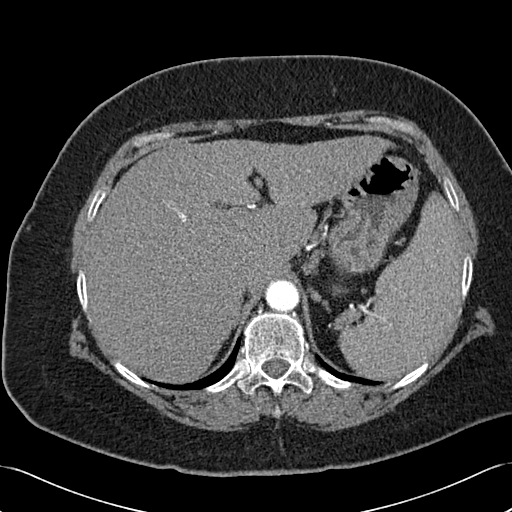
[im 48/228  lung]
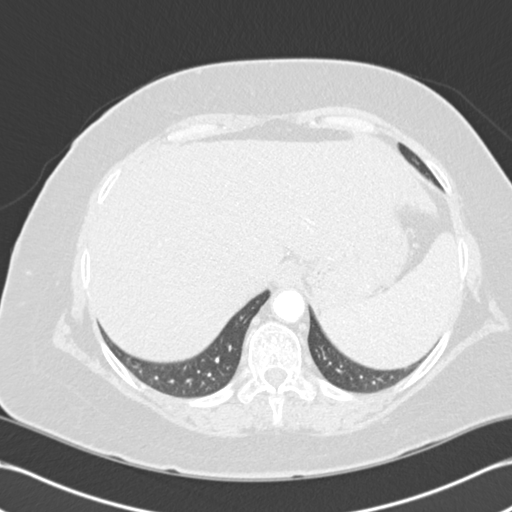
[im 60/228  soft-tissue]
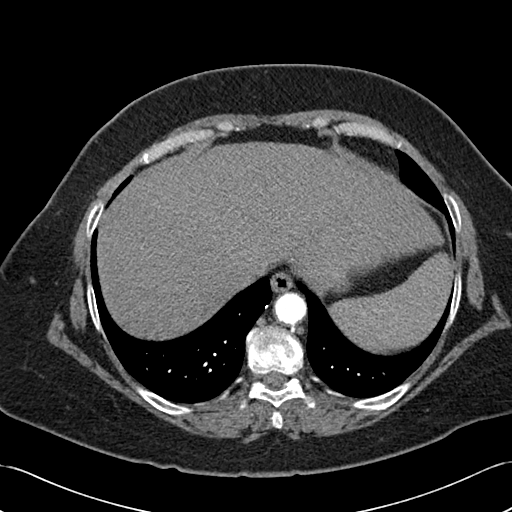
[im 72/228  lung]
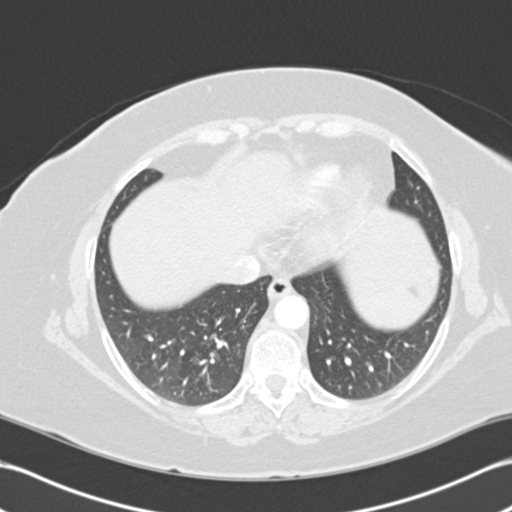
[im 84/228  soft-tissue]
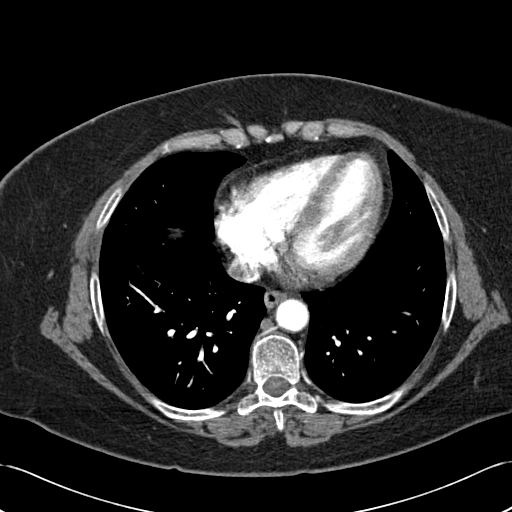
[im 96/228  lung]
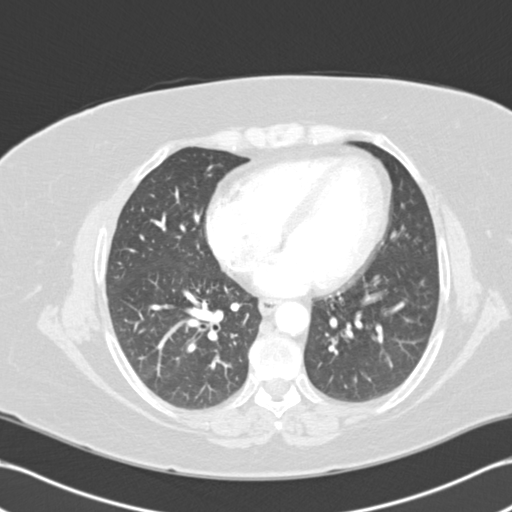
[im 120/228  soft-tissue]
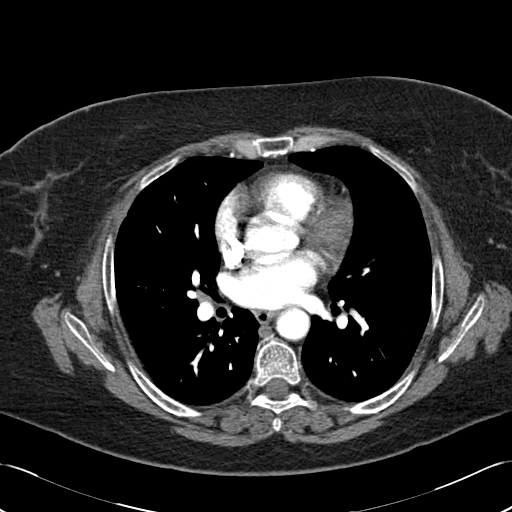
[im 132/228  lung]
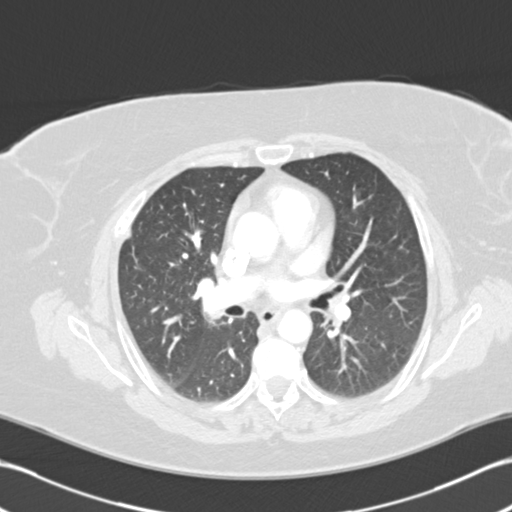
[im 144/228  soft-tissue]
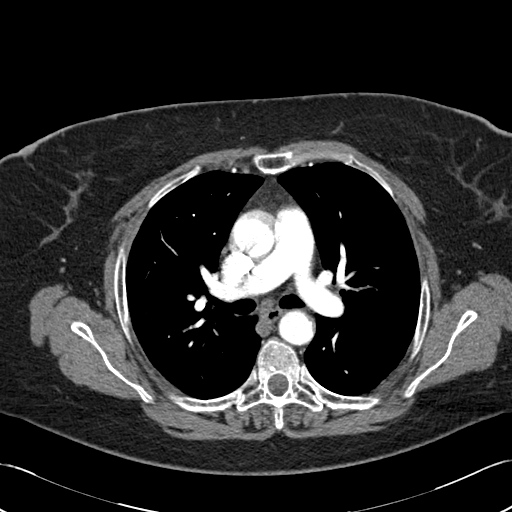
[im 156/228  lung]
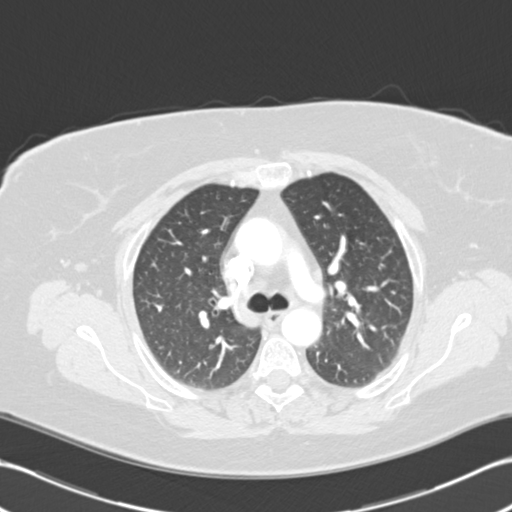
[im 168/228  soft-tissue]
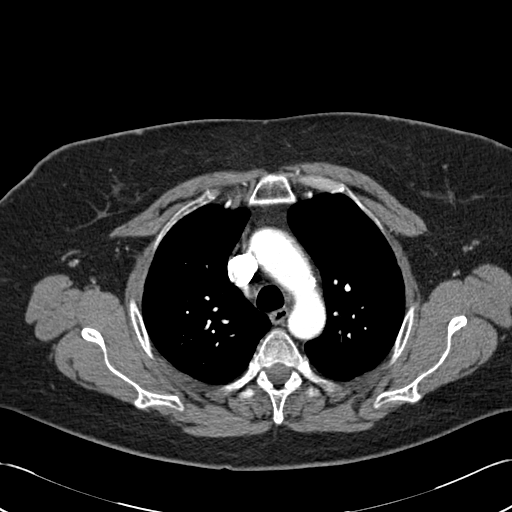
[im 192/228  lung]
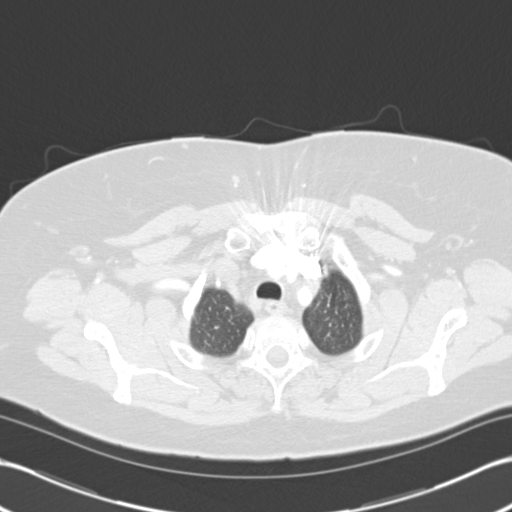
[im 204/228  soft-tissue]
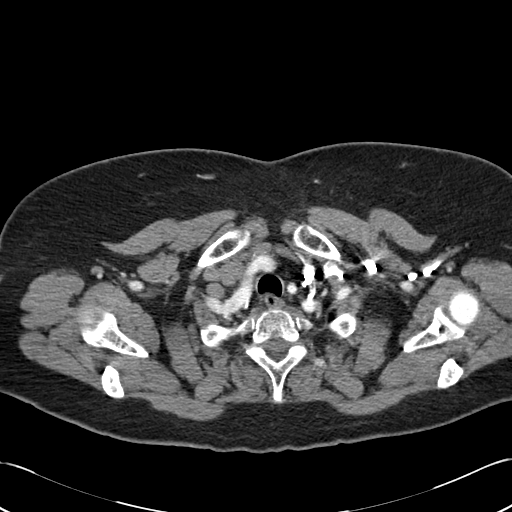
[im 216/228  lung]
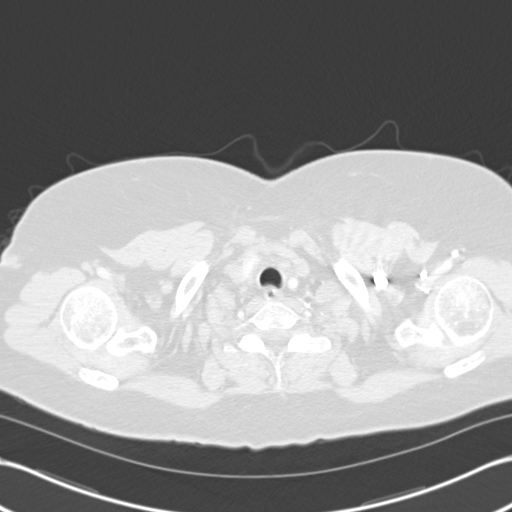

[Series 7: cor mpr 2.0 · coronal · 0.55mm/px · 3 of 133 slices shown]
[im 34/133  soft-tissue]
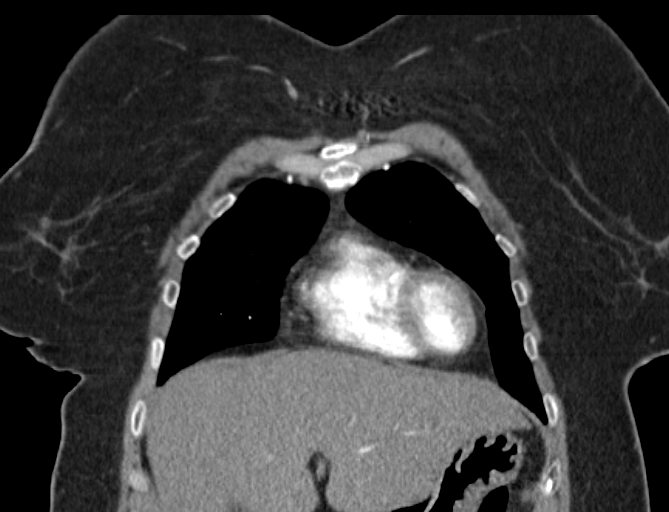
[im 67/133  soft-tissue]
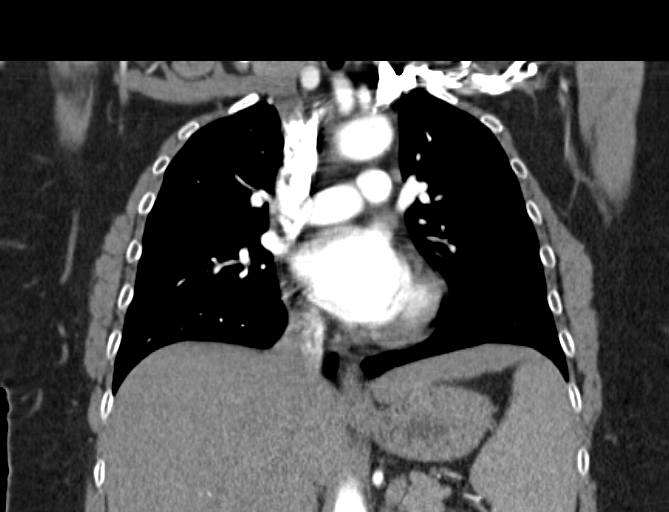
[im 100/133  soft-tissue]
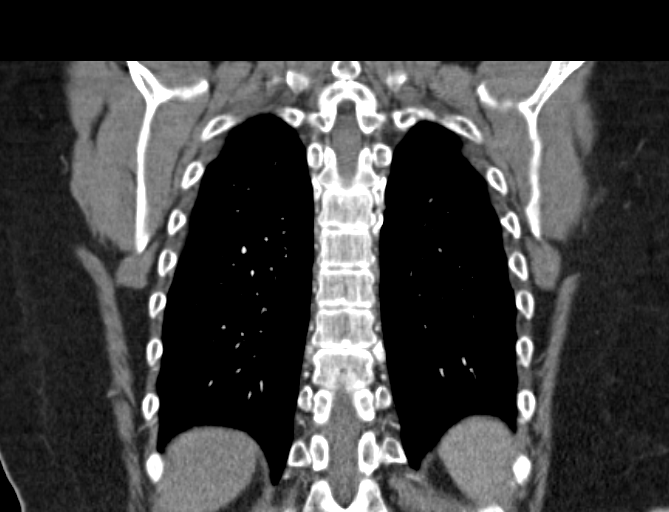

[18 of 46 positions shown; findings below may reference images not displayed]

FINDINGS: The lungs are clear. There is no pleural effusion or pneumothorax.
The central airways are patent.

The thoracic aorta appears unremarkable. The origins of the great
vessels of the aortic arch appear patent. No CT evidence of
pulmonary embolism. There is no hilar or mediastinal adenopathy. The
esophagus is grossly unremarkable.

There is no axillary adenopathy. The chest wall soft tissues appear
unremarkable. There is mild degenerative changes of the spine. No
acute fracture.

Top-normal spleen size. The visualized upper abdomen is otherwise
unremarkable.

Review of the MIP images confirms the above findings.
IMPRESSION: No CT evidence of pulmonary embolism.

## 2019-07-29 ENCOUNTER — Ambulatory Visit
Admit: 2019-07-29 | Discharge: 2019-07-31 | Disposition: A | Discharge status: Home / Self Care | Payer: MEDICAID | Admitting: Internal Medicine

## 2019-07-29 DIAGNOSIS — K7031 Alcoholic cirrhosis of liver with ascites: Principal | ICD-10-CM

## 2019-07-30 DIAGNOSIS — K7031 Alcoholic cirrhosis of liver with ascites: Principal | ICD-10-CM

## 2019-07-31 DIAGNOSIS — K7031 Alcoholic cirrhosis of liver with ascites: Principal | ICD-10-CM

## 2019-07-31 MED FILL — PREDNISOLONE SODIUM PHOSPHATE 15 MG/5 ML (3 MG/ML) ORAL SOLUTION: 7 days supply | Qty: 100 | Fill #0 | Status: AC

## 2019-07-31 MED FILL — PANTOPRAZOLE 40 MG TABLET,DELAYED RELEASE: 7 days supply | Qty: 7 | Fill #0 | Status: AC

## 2019-08-01 MED ORDER — PANTOPRAZOLE 40 MG TABLET,DELAYED RELEASE
ORAL_TABLET | Freq: Every day | ORAL | 0 refills | 30.00000 days | Status: CP
Start: 2019-08-01 — End: 2019-08-31
  Filled 2019-07-31: qty 7, 7d supply, fill #0

## 2019-08-01 MED ORDER — PREDNISOLONE SODIUM PHOSPHATE 15 MG/5 ML (3 MG/ML) ORAL SOLUTION
Freq: Every day | ORAL | 0 refills | 7.00000 days | Status: CP
Start: 2019-08-01 — End: 2019-08-08
  Filled 2019-07-31: qty 100, 7d supply, fill #0

## 2019-08-07 ENCOUNTER — Ambulatory Visit: Admit: 2019-08-07 | Discharge: 2019-08-08

## 2019-08-07 DIAGNOSIS — K7200 Acute and subacute hepatic failure without coma: Principal | ICD-10-CM

## 2019-08-07 MED ORDER — PREDNISOLONE 5 MG TABLET
ORAL | 0 refills | 36 days | Status: CP
Start: 2019-08-07 — End: 2019-09-12

## 2019-08-23 ENCOUNTER — Ambulatory Visit
Admit: 2019-08-23 | Discharge: 2019-08-25 | Disposition: A | Discharge status: Home / Self Care | Payer: MEDICAID | Admitting: Hospitalist

## 2019-08-23 ENCOUNTER — Encounter
Admit: 2019-08-23 | Discharge: 2019-08-25 | Disposition: A | Discharge status: Home / Self Care | Payer: MEDICAID | Attending: Anesthesiology | Admitting: Hospitalist

## 2019-08-23 DIAGNOSIS — D6959 Other secondary thrombocytopenia: Secondary | ICD-10-CM

## 2019-08-23 DIAGNOSIS — K3189 Other diseases of stomach and duodenum: Secondary | ICD-10-CM

## 2019-08-23 DIAGNOSIS — R3121 Asymptomatic microscopic hematuria: Secondary | ICD-10-CM

## 2019-08-23 DIAGNOSIS — Z20828 Contact with and (suspected) exposure to other viral communicable diseases: Secondary | ICD-10-CM

## 2019-08-23 DIAGNOSIS — R140 Abdominal distension (gaseous): Secondary | ICD-10-CM

## 2019-08-23 DIAGNOSIS — I8510 Secondary esophageal varices without bleeding: Secondary | ICD-10-CM

## 2019-08-23 DIAGNOSIS — B182 Chronic viral hepatitis C: Secondary | ICD-10-CM

## 2019-08-23 DIAGNOSIS — K7031 Alcoholic cirrhosis of liver with ascites: Secondary | ICD-10-CM

## 2019-08-23 DIAGNOSIS — F17210 Nicotine dependence, cigarettes, uncomplicated: Secondary | ICD-10-CM

## 2019-08-23 DIAGNOSIS — F1010 Alcohol abuse, uncomplicated: Secondary | ICD-10-CM

## 2019-08-23 DIAGNOSIS — K648 Other hemorrhoids: Secondary | ICD-10-CM

## 2019-08-23 DIAGNOSIS — K766 Portal hypertension: Secondary | ICD-10-CM

## 2019-08-23 DIAGNOSIS — D539 Nutritional anemia, unspecified: Secondary | ICD-10-CM

## 2019-08-23 DIAGNOSIS — R601 Generalized edema: Secondary | ICD-10-CM

## 2019-08-23 DIAGNOSIS — K31819 Angiodysplasia of stomach and duodenum without bleeding: Secondary | ICD-10-CM

## 2019-08-23 DIAGNOSIS — K7290 Hepatic failure, unspecified without coma: Secondary | ICD-10-CM

## 2019-08-24 DIAGNOSIS — F1010 Alcohol abuse, uncomplicated: Secondary | ICD-10-CM

## 2019-08-24 DIAGNOSIS — I8510 Secondary esophageal varices without bleeding: Secondary | ICD-10-CM

## 2019-08-24 DIAGNOSIS — D6959 Other secondary thrombocytopenia: Secondary | ICD-10-CM

## 2019-08-24 DIAGNOSIS — K3189 Other diseases of stomach and duodenum: Secondary | ICD-10-CM

## 2019-08-24 DIAGNOSIS — B182 Chronic viral hepatitis C: Secondary | ICD-10-CM

## 2019-08-24 DIAGNOSIS — K648 Other hemorrhoids: Secondary | ICD-10-CM

## 2019-08-24 DIAGNOSIS — R140 Abdominal distension (gaseous): Secondary | ICD-10-CM

## 2019-08-24 DIAGNOSIS — F17210 Nicotine dependence, cigarettes, uncomplicated: Secondary | ICD-10-CM

## 2019-08-24 DIAGNOSIS — R3121 Asymptomatic microscopic hematuria: Secondary | ICD-10-CM

## 2019-08-24 DIAGNOSIS — K7031 Alcoholic cirrhosis of liver with ascites: Secondary | ICD-10-CM

## 2019-08-24 DIAGNOSIS — Z20828 Contact with and (suspected) exposure to other viral communicable diseases: Secondary | ICD-10-CM

## 2019-08-24 DIAGNOSIS — K766 Portal hypertension: Secondary | ICD-10-CM

## 2019-08-24 DIAGNOSIS — D539 Nutritional anemia, unspecified: Secondary | ICD-10-CM

## 2019-08-24 DIAGNOSIS — K31819 Angiodysplasia of stomach and duodenum without bleeding: Secondary | ICD-10-CM

## 2019-08-25 DIAGNOSIS — F1010 Alcohol abuse, uncomplicated: Secondary | ICD-10-CM

## 2019-08-25 DIAGNOSIS — K7031 Alcoholic cirrhosis of liver with ascites: Secondary | ICD-10-CM

## 2019-08-25 DIAGNOSIS — K3189 Other diseases of stomach and duodenum: Secondary | ICD-10-CM

## 2019-08-25 DIAGNOSIS — R3121 Asymptomatic microscopic hematuria: Secondary | ICD-10-CM

## 2019-08-25 DIAGNOSIS — R140 Abdominal distension (gaseous): Secondary | ICD-10-CM

## 2019-08-25 DIAGNOSIS — K766 Portal hypertension: Secondary | ICD-10-CM

## 2019-08-25 DIAGNOSIS — Z20828 Contact with and (suspected) exposure to other viral communicable diseases: Secondary | ICD-10-CM

## 2019-08-25 DIAGNOSIS — F17210 Nicotine dependence, cigarettes, uncomplicated: Secondary | ICD-10-CM

## 2019-08-25 DIAGNOSIS — B182 Chronic viral hepatitis C: Secondary | ICD-10-CM

## 2019-08-25 DIAGNOSIS — D6959 Other secondary thrombocytopenia: Secondary | ICD-10-CM

## 2019-08-25 DIAGNOSIS — I8510 Secondary esophageal varices without bleeding: Secondary | ICD-10-CM

## 2019-08-25 DIAGNOSIS — D539 Nutritional anemia, unspecified: Secondary | ICD-10-CM

## 2019-08-25 DIAGNOSIS — K648 Other hemorrhoids: Secondary | ICD-10-CM

## 2019-08-25 DIAGNOSIS — K31819 Angiodysplasia of stomach and duodenum without bleeding: Secondary | ICD-10-CM

## 2019-08-25 MED ORDER — SPIRONOLACTONE 100 MG TABLET
ORAL_TABLET | Freq: Every day | ORAL | 0 refills | 30 days | Status: CP
Start: 2019-08-25 — End: 2019-09-22
  Filled 2019-08-25: qty 30, 30d supply, fill #0

## 2019-08-25 MED ORDER — FUROSEMIDE 40 MG TABLET
ORAL_TABLET | Freq: Every day | ORAL | 0 refills | 30 days | Status: CP
Start: 2019-08-25 — End: 2019-09-22
  Filled 2019-08-25: qty 30, 30d supply, fill #0

## 2019-08-25 MED ORDER — BACLOFEN 10 MG TABLET
ORAL_TABLET | Freq: Three times a day (TID) | ORAL | 0 refills | 30.00000 days | Status: CP
Start: 2019-08-25 — End: 2019-09-22
  Filled 2019-08-25: qty 45, 30d supply, fill #0

## 2019-08-25 MED ORDER — NICOTINE 14 MG/24 HR DAILY TRANSDERMAL PATCH
MEDICATED_PATCH | Freq: Every day | TRANSDERMAL | 0 refills | 28.00000 days | Status: CP
Start: 2019-08-25 — End: ?
  Filled 2019-08-25: qty 28, 28d supply, fill #0

## 2019-08-25 MED ORDER — LACTULOSE 10 GRAM/15 ML ORAL SOLUTION
Freq: Two times a day (BID) | ORAL | 0 refills | 32 days | Status: CP
Start: 2019-08-25 — End: 2019-09-22
  Filled 2019-08-25: qty 1892, 32d supply, fill #0

## 2019-08-25 MED ORDER — NICOTINE (POLACRILEX) 2 MG GUM
BUCCAL | 0 refills | 10 days | Status: CP | PRN
Start: 2019-08-25 — End: 2019-09-24
  Filled 2019-08-25: qty 110, 10d supply, fill #0

## 2019-08-25 MED ORDER — MELATONIN 3 MG TABLET
ORAL_TABLET | Freq: Every evening | ORAL | 0 refills | 100 days | Status: CP | PRN
Start: 2019-08-25 — End: 2019-12-03
  Filled 2019-08-25: qty 100, 100d supply, fill #0

## 2019-08-25 MED FILL — VITAMIN B-1 100 MG TABLET: 100 days supply | Qty: 100 | Fill #0 | Status: AC

## 2019-08-25 MED FILL — LACTULOSE 10 GRAM/15 ML ORAL SOLUTION: 32 days supply | Qty: 1892 | Fill #0 | Status: AC

## 2019-08-25 MED FILL — VITAMIN B-1 100 MG TABLET: ORAL | 100 days supply | Qty: 100 | Fill #0

## 2019-08-25 MED FILL — MELATONIN 3 MG TABLET: 100 days supply | Qty: 100 | Fill #0 | Status: AC

## 2019-08-25 MED FILL — SPIRONOLACTONE 100 MG TABLET: 30 days supply | Qty: 30 | Fill #0 | Status: AC

## 2019-08-25 MED FILL — BACLOFEN 10 MG TABLET: 30 days supply | Qty: 45 | Fill #0 | Status: AC

## 2019-08-25 MED FILL — NICOTINE 14 MG/24 HR DAILY TRANSDERMAL PATCH: 28 days supply | Qty: 28 | Fill #0 | Status: AC

## 2019-08-25 MED FILL — NICOTINE (POLACRILEX) 2 MG GUM: 10 days supply | Qty: 110 | Fill #0 | Status: AC

## 2019-08-25 MED FILL — FUROSEMIDE 40 MG TABLET: 30 days supply | Qty: 30 | Fill #0 | Status: AC

## 2019-08-25 MED FILL — FOLIC ACID 1 MG TABLET: 30 days supply | Qty: 30 | Fill #0 | Status: AC

## 2019-08-26 MED ORDER — FOLIC ACID 1 MG TABLET
ORAL_TABLET | Freq: Every day | ORAL | 11 refills | 30 days | Status: CP
Start: 2019-08-26 — End: 2019-09-22
  Filled 2019-08-25 – 2019-09-22 (×2): qty 30, 30d supply, fill #0

## 2019-08-26 MED ORDER — THIAMINE HCL (VITAMIN B1) 100 MG TABLET
ORAL_TABLET | Freq: Every day | ORAL | 11 refills | 100.00000 days | Status: CP
Start: 2019-08-26 — End: 2019-09-22
  Filled 2019-09-22: qty 100, 100d supply, fill #0

## 2019-09-22 ENCOUNTER — Ambulatory Visit: Admit: 2019-09-22 | Discharge: 2019-09-23 | Payer: MEDICAID | Attending: Internal Medicine | Primary: Internal Medicine

## 2019-09-22 DIAGNOSIS — K7290 Hepatic failure, unspecified without coma: Principal | ICD-10-CM

## 2019-09-22 DIAGNOSIS — R109 Unspecified abdominal pain: Principal | ICD-10-CM

## 2019-09-22 DIAGNOSIS — K7460 Unspecified cirrhosis of liver: Principal | ICD-10-CM

## 2019-09-22 MED ORDER — THIAMINE HCL (VITAMIN B1) 100 MG TABLET: 100 mg | tablet | Freq: Every day | 3 refills | 90 days | Status: AC

## 2019-09-22 MED ORDER — FOLIC ACID 1 MG TABLET: 1 mg | tablet | Freq: Every day | 3 refills | 90 days | Status: AC

## 2019-09-22 MED ORDER — OMEPRAZOLE 40 MG CAPSULE,DELAYED RELEASE: 40 mg | capsule | Freq: Every day | 3 refills | 90 days | Status: AC

## 2019-09-22 MED ORDER — FUROSEMIDE 40 MG TABLET: 40 mg | tablet | Freq: Every day | 3 refills | 90 days | Status: AC

## 2019-09-22 MED ORDER — SPIRONOLACTONE 100 MG TABLET: 100 mg | tablet | Freq: Every day | 3 refills | 90 days | Status: AC

## 2019-09-22 MED ORDER — LACTULOSE 10 GRAM/15 ML ORAL SOLUTION: 20 g | mL | Freq: Three times a day (TID) | 3 refills | 90 days | Status: AC

## 2019-09-22 MED ORDER — BACLOFEN 5 MG TABLET: 5 mg | tablet | Freq: Three times a day (TID) | 3 refills | 90 days | Status: AC

## 2019-09-22 MED FILL — LACTULOSE 10 GRAM/15 ML ORAL SOLUTION: ORAL | 30 days supply | Qty: 2700 | Fill #0

## 2019-09-22 MED FILL — BACLOFEN 5 MG TABLET: 30 days supply | Qty: 90 | Fill #0 | Status: AC

## 2019-09-22 MED FILL — OMEPRAZOLE 40 MG CAPSULE,DELAYED RELEASE: 30 days supply | Qty: 30 | Fill #0 | Status: AC

## 2019-09-22 MED FILL — LACTULOSE 10 GRAM/15 ML ORAL SOLUTION: 30 days supply | Qty: 2700 | Fill #0 | Status: AC

## 2019-09-22 MED FILL — SPIRONOLACTONE 100 MG TABLET: 30 days supply | Qty: 30 | Fill #0 | Status: AC

## 2019-09-22 MED FILL — SPIRONOLACTONE 100 MG TABLET: ORAL | 30 days supply | Qty: 30 | Fill #0

## 2019-09-22 MED FILL — BACLOFEN 5 MG TABLET: ORAL | 30 days supply | Qty: 90 | Fill #0

## 2019-09-22 MED FILL — OMEPRAZOLE 40 MG CAPSULE,DELAYED RELEASE: ORAL | 30 days supply | Qty: 30 | Fill #0

## 2019-09-22 MED FILL — FUROSEMIDE 40 MG TABLET: ORAL | 30 days supply | Qty: 30 | Fill #0

## 2019-09-22 MED FILL — FOLIC ACID 1 MG TABLET: 30 days supply | Qty: 30 | Fill #0 | Status: AC

## 2019-09-22 MED FILL — VITAMIN B-1 100 MG TABLET: 100 days supply | Qty: 100 | Fill #0 | Status: AC

## 2019-09-22 MED FILL — FUROSEMIDE 40 MG TABLET: 30 days supply | Qty: 30 | Fill #0 | Status: AC

## 2019-10-01 ENCOUNTER — Ambulatory Visit
Admit: 2019-10-01 | Discharge: 2019-10-05 | Disposition: A | Discharge status: Home / Self Care | Payer: MEDICAID | Admitting: Internal Medicine

## 2019-10-05 MED ORDER — CEFDINIR 300 MG CAPSULE
ORAL_CAPSULE | Freq: Two times a day (BID) | ORAL | 0 refills | 10 days | Status: CP
Start: 2019-10-05 — End: 2019-10-15

## 2019-10-05 MED ORDER — INSULIN HUMAN U-100 NPH-REGULR 70-30 MIX 100 UNIT/ML SUBCUTANEOUS SUSP
Freq: Two times a day (BID) | SUBCUTANEOUS | 0 refills | 30 days | Status: CP
Start: 2019-10-05 — End: 2019-11-04

## 2019-10-05 MED ORDER — FUROSEMIDE 20 MG TABLET
ORAL_TABLET | Freq: Every day | ORAL | 0 refills | 30 days | Status: CP
Start: 2019-10-05 — End: 2019-11-04

## 2019-10-05 MED ORDER — SPIRONOLACTONE 100 MG TABLET
ORAL_TABLET | Freq: Every day | ORAL | 0 refills | 30 days | Status: CP
Start: 2019-10-05 — End: 2019-11-04

## 2019-10-18 ENCOUNTER — Encounter
Admit: 2019-10-18 | Discharge: 2019-10-21 | Disposition: A | Discharge status: Home / Self Care | Payer: MEDICAID | Attending: Anesthesiology

## 2019-10-18 ENCOUNTER — Ambulatory Visit: Admit: 2019-10-18 | Discharge: 2019-10-21 | Disposition: A | Discharge status: Home / Self Care | Payer: MEDICAID

## 2019-10-21 MED ORDER — LACTULOSE 10 GRAM/15 ML ORAL SOLUTION
Freq: Two times a day (BID) | ORAL | 0 refills | 30.00000 days | Status: CP
Start: 2019-10-21 — End: ?

## 2019-10-21 MED ORDER — HYDROCORTISONE ACETATE 25 MG RECTAL SUPPOSITORY
Freq: Every evening | RECTAL | 0 refills | 6.00000 days | Status: CP
Start: 2019-10-21 — End: 2019-10-27

## 2019-11-12 ENCOUNTER — Ambulatory Visit: Admit: 2019-11-12 | Discharge: 2019-11-13 | Payer: MEDICAID | Attending: Internal Medicine | Primary: Internal Medicine

## 2019-11-12 DIAGNOSIS — K7031 Alcoholic cirrhosis of liver with ascites: Principal | ICD-10-CM

## 2019-11-12 DIAGNOSIS — R911 Solitary pulmonary nodule: Principal | ICD-10-CM

## 2019-11-12 MED ORDER — FUROSEMIDE 20 MG TABLET
ORAL_TABLET | Freq: Every day | ORAL | 3 refills | 90.00000 days | Status: CP
Start: 2019-11-12 — End: 2020-11-06

## 2019-11-12 MED ORDER — SPIRONOLACTONE 100 MG TABLET
ORAL_TABLET | Freq: Every day | ORAL | 3 refills | 90.00000 days | Status: CP
Start: 2019-11-12 — End: 2020-11-06

## 2019-11-12 MED ORDER — LACTULOSE 10 GRAM/15 ML ORAL SOLUTION
Freq: Two times a day (BID) | ORAL | 11 refills | 30.00000 days | Status: CP
Start: 2019-11-12 — End: 2020-11-06

## 2019-11-16 DIAGNOSIS — B182 Chronic viral hepatitis C: Principal | ICD-10-CM

## 2019-11-16 DIAGNOSIS — K7290 Hepatic failure, unspecified without coma: Principal | ICD-10-CM

## 2019-11-16 MED ORDER — SOFOSBUVIR 400 MG-VELPATASVIR 100 MG TABLET
ORAL_TABLET | Freq: Every day | ORAL | 5 refills | 28 days | Status: CP
Start: 2019-11-16 — End: ?

## 2019-11-23 ENCOUNTER — Ambulatory Visit: Admit: 2019-11-23 | Discharge: 2019-11-24 | Payer: MEDICAID

## 2019-11-26 DIAGNOSIS — K7460 Unspecified cirrhosis of liver: Principal | ICD-10-CM

## 2019-11-26 DIAGNOSIS — R188 Other ascites: Principal | ICD-10-CM

## 2019-11-26 DIAGNOSIS — R911 Solitary pulmonary nodule: Principal | ICD-10-CM

## 2019-11-26 DIAGNOSIS — S22000A Wedge compression fracture of unspecified thoracic vertebra, initial encounter for closed fracture: Principal | ICD-10-CM

## 2019-11-26 MED ORDER — FUROSEMIDE 40 MG TABLET
ORAL_TABLET | Freq: Every day | ORAL | 5 refills | 30 days | Status: CP
Start: 2019-11-26 — End: ?

## 2019-12-03 DIAGNOSIS — B182 Chronic viral hepatitis C: Principal | ICD-10-CM

## 2019-12-03 DIAGNOSIS — Z79899 Other long term (current) drug therapy: Principal | ICD-10-CM

## 2019-12-03 DIAGNOSIS — K7290 Hepatic failure, unspecified without coma: Principal | ICD-10-CM

## 2019-12-24 ENCOUNTER — Telehealth: Admit: 2019-12-24 | Discharge: 2019-12-25 | Payer: MEDICAID | Attending: Internal Medicine | Primary: Internal Medicine

## 2020-01-20 ENCOUNTER — Encounter: Admit: 2020-01-20 | Discharge: 2020-01-21 | Payer: MEDICAID

## 2020-01-29 ENCOUNTER — Encounter: Admit: 2020-01-29 | Discharge: 2020-01-29 | Payer: MEDICAID | Attending: Internal Medicine | Primary: Internal Medicine

## 2020-01-29 DIAGNOSIS — M8000XA Age-related osteoporosis with current pathological fracture, unspecified site, initial encounter for fracture: Principal | ICD-10-CM

## 2020-01-29 DIAGNOSIS — S22000A Wedge compression fracture of unspecified thoracic vertebra, initial encounter for closed fracture: Principal | ICD-10-CM

## 2020-01-29 MED ORDER — ABALOPARATIDE 80 MCG/DOSE(3,120 MCG/1.56 ML) SUBCUTANEOUS PEN INJECTOR
Freq: Every day | SUBCUTANEOUS | 0 refills | 0 days | Status: CP
Start: 2020-01-29 — End: ?

## 2020-01-29 MED ORDER — ERGOCALCIFEROL (VITAMIN D2) 1,250 MCG (50,000 UNIT) CAPSULE
ORAL_CAPSULE | ORAL | 0 refills | 56.00000 days | Status: CP
Start: 2020-01-29 — End: 2020-04-28
  Filled 2020-02-29: qty 8, 56d supply, fill #0

## 2020-02-01 DIAGNOSIS — M8000XA Age-related osteoporosis with current pathological fracture, unspecified site, initial encounter for fracture: Principal | ICD-10-CM

## 2020-02-11 ENCOUNTER — Ambulatory Visit: Admit: 2020-02-11 | Discharge: 2020-02-11 | Payer: MEDICAID | Attending: Internal Medicine | Primary: Internal Medicine

## 2020-02-11 ENCOUNTER — Ambulatory Visit: Admit: 2020-02-11 | Discharge: 2020-02-11 | Payer: MEDICAID

## 2020-02-11 MED ORDER — LACTULOSE 10 GRAM/15 ML ORAL SOLUTION
Freq: Two times a day (BID) | ORAL | 11 refills | 30.00000 days | Status: CP
Start: 2020-02-11 — End: 2021-02-05

## 2020-02-11 MED ORDER — SPIRONOLACTONE 100 MG TABLET
ORAL_TABLET | Freq: Every day | ORAL | 11 refills | 30 days | Status: CP
Start: 2020-02-11 — End: 2021-02-05

## 2020-02-11 MED ORDER — FUROSEMIDE 40 MG TABLET
ORAL_TABLET | Freq: Every day | ORAL | 11 refills | 30 days | Status: CP
Start: 2020-02-11 — End: ?

## 2020-02-29 MED ORDER — FUROSEMIDE 40 MG TABLET
ORAL_TABLET | Freq: Every day | ORAL | 11 refills | 30.00000 days | Status: CP
Start: 2020-02-29 — End: ?

## 2020-02-29 MED ORDER — BACLOFEN 5 MG TABLET
ORAL_TABLET | Freq: Three times a day (TID) | ORAL | 3 refills | 90 days | Status: CP
Start: 2020-02-29 — End: 2021-02-23

## 2020-02-29 MED FILL — OMEPRAZOLE 40 MG CAPSULE,DELAYED RELEASE: 30 days supply | Qty: 30 | Fill #0 | Status: AC

## 2020-02-29 MED FILL — ERGOCALCIFEROL (VITAMIN D2) 1,250 MCG (50,000 UNIT) CAPSULE: 56 days supply | Qty: 8 | Fill #0 | Status: AC

## 2020-02-29 MED FILL — OMEPRAZOLE 40 MG CAPSULE,DELAYED RELEASE: ORAL | 30 days supply | Qty: 30 | Fill #0

## 2020-04-01 ENCOUNTER — Ambulatory Visit
Admit: 2020-04-01 | Discharge: 2020-04-05 | Disposition: A | Discharge status: Home / Self Care | Payer: MEDICAID | Admitting: Allergy

## 2020-04-05 MED ORDER — DICLOFENAC 3 % TOPICAL GEL
Freq: Two times a day (BID) | TOPICAL | 0 refills | 0 days | Status: CP
Start: 2020-04-05 — End: 2020-04-05

## 2020-04-05 MED ORDER — LACTULOSE 10 GRAM/15 ML ORAL SOLUTION
0 refills | 0 days | Status: CP
Start: 2020-04-05 — End: ?
  Filled 2020-04-05: qty 1800, 14d supply, fill #0

## 2020-04-05 MED FILL — LACTULOSE 10 GRAM/15 ML ORAL SOLUTION: 14 days supply | Qty: 1800 | Fill #0 | Status: AC

## 2020-04-05 MED FILL — VITAMIN B-1 100 MG TABLET: 110 days supply | Qty: 110 | Fill #0 | Status: AC

## 2020-04-06 MED ORDER — THIAMINE HCL (VITAMIN B1) 100 MG TABLET
ORAL_TABLET | Freq: Every day | ORAL | 11 refills | 110.00000 days | Status: CP
Start: 2020-04-06 — End: 2021-04-06
  Filled 2020-04-05: qty 110, 110d supply, fill #0

## 2020-04-07 ENCOUNTER — Encounter: Admit: 2020-04-07 | Discharge: 2020-04-08 | Discharge status: Against Medical Advice | Payer: MEDICAID

## 2020-04-20 ENCOUNTER — Encounter: Admit: 2020-04-20 | Discharge: 2020-04-21 | Payer: MEDICAID

## 2020-04-20 DIAGNOSIS — K649 Unspecified hemorrhoids: Principal | ICD-10-CM

## 2020-04-21 MED ORDER — HYDROCORTISONE ACETATE 25 MG RECTAL SUPPOSITORY
Freq: Two times a day (BID) | RECTAL | 0 refills | 14 days | Status: CP
Start: 2020-04-21 — End: 2020-05-05

## 2020-04-21 MED ORDER — PSYLLIUM HUSK 0.52 GRAM CAPSULE
ORAL_CAPSULE | Freq: Every day | ORAL | 0 refills | 30.00000 days | Status: CP
Start: 2020-04-21 — End: 2020-05-21

## 2020-04-22 DIAGNOSIS — Z8719 Personal history of other diseases of the digestive system: Principal | ICD-10-CM

## 2020-05-03 ENCOUNTER — Emergency Department
Admit: 2020-05-03 | Discharge: 2020-05-03 | Disposition: A | Discharge status: Home / Self Care | Payer: MEDICAID | Attending: Student in an Organized Health Care Education/Training Program

## 2020-05-03 ENCOUNTER — Encounter
Admit: 2020-05-03 | Discharge: 2020-05-03 | Disposition: A | Discharge status: Home / Self Care | Payer: MEDICAID | Attending: Student in an Organized Health Care Education/Training Program

## 2020-05-03 ENCOUNTER — Ambulatory Visit
Admit: 2020-05-03 | Discharge: 2020-05-03 | Disposition: A | Discharge status: Home / Self Care | Payer: MEDICAID | Attending: Student in an Organized Health Care Education/Training Program

## 2020-05-03 DIAGNOSIS — R404 Transient alteration of awareness: Principal | ICD-10-CM

## 2020-05-03 DIAGNOSIS — E7220 Disorder of urea cycle metabolism, unspecified: Principal | ICD-10-CM

## 2020-05-03 MED ORDER — DICLOFENAC 1 % TOPICAL GEL
Freq: Four times a day (QID) | TOPICAL | 0 refills | 13.00000 days | Status: CP
Start: 2020-05-03 — End: ?

## 2020-05-16 ENCOUNTER — Ambulatory Visit: Admit: 2020-05-16 | Discharge: 2020-05-16 | Payer: MEDICAID

## 2020-05-26 ENCOUNTER — Encounter: Admit: 2020-05-26 | Discharge: 2020-05-27 | Payer: MEDICAID | Attending: Internal Medicine | Primary: Internal Medicine

## 2020-05-26 DIAGNOSIS — K7031 Alcoholic cirrhosis of liver with ascites: Principal | ICD-10-CM

## 2020-05-26 MED ORDER — RIFAXIMIN 550 MG TABLET
ORAL_TABLET | Freq: Two times a day (BID) | ORAL | 11 refills | 30.00000 days | Status: CP
Start: 2020-05-26 — End: 2021-05-21

## 2020-06-08 ENCOUNTER — Ambulatory Visit: Admit: 2020-06-08 | Discharge: 2020-06-09 | Payer: MEDICAID

## 2020-06-08 DIAGNOSIS — M8000XA Age-related osteoporosis with current pathological fracture, unspecified site, initial encounter for fracture: Principal | ICD-10-CM

## 2020-06-08 DIAGNOSIS — E559 Vitamin D deficiency, unspecified: Principal | ICD-10-CM

## 2020-06-10 DIAGNOSIS — M8000XA Age-related osteoporosis with current pathological fracture, unspecified site, initial encounter for fracture: Principal | ICD-10-CM

## 2020-06-10 MED ORDER — TYMLOS 80 MCG/DOSE (3,120 MCG/1.56 ML) SUBCUTANEOUS PEN INJECTOR
Freq: Every day | SUBCUTANEOUS | 3 refills | 0.00000 days | Status: CP
Start: 2020-06-10 — End: 2020-07-10

## 2020-07-04 ENCOUNTER — Encounter
Admit: 2020-07-04 | Discharge: 2020-07-05 | Payer: MEDICAID | Attending: Pharmacist Clinician (PhC)/ Clinical Pharmacy Specialist | Primary: Pharmacist Clinician (PhC)/ Clinical Pharmacy Specialist

## 2020-07-04 DIAGNOSIS — M8000XA Age-related osteoporosis with current pathological fracture, unspecified site, initial encounter for fracture: Principal | ICD-10-CM

## 2020-07-04 MED ORDER — TYMLOS 80 MCG/DOSE (3,120 MCG/1.56 ML) SUBCUTANEOUS PEN INJECTOR
Freq: Every day | SUBCUTANEOUS | 3 refills | 0.00000 days | Status: CP
Start: 2020-07-04 — End: 2020-08-03
  Filled 2020-07-12: qty 1.56, 30d supply, fill #0

## 2020-07-04 NOTE — Unmapped (Signed)
[facility] Endocrinology at [facility]  [address]  [place], [place] [address]    Clinical Pharmacist Visit Summary    Assessment and Plan:   1. Osteoporosis: pt is at very high risk of future fractures given that she has a hx of vertebral, wrist, and rib fractrures; has T scores of -3.1 at the spine, -2.0 at the left hip, and -2.7 at the left femoral neck; and her risk factors for fracture include: age, sex, post menopausal, low vitamin D, extensive alcohol/tobacco use hx. Since pt is continuing with further dental procedures and has poor dentition, we want to avoid any type of bisphosphanate therapy d/t ONJ risk.  Given that she is very high risk for fracture and is currently w/o osteoporosi therapy, we are trying to pursue a PA for anabolic therapy (Tymlos) for Holly Hanna.      ?? Follow up with clinical pharmacist prn for Tymlos counseling once PA is approved  ?? Follow up with Dr. Alphia Kava around 09/08/20    Alexis Goodell, PharmD, CPP, BCACP    I spent a total of 25 minutes face to face with the patient delivering clinical care and providing education/counseling.     At Last Visit (06/08/20 w/Dr. Lyda Perone):      1. Osteoporosis with history of pathological fracture detected on imaging fall 2020.  She has multiple risk factors for low bone density and fragility fracture as noted below.  She meets criteria for treatment.    ?? Check the following labs today, mainly to see if her vitamin D is improved and safe for administration of bone medication, in particular if she receives bisphosphonate.  Note that measured calcium level will need to be corrected for her albumin level.  - Parathyroid Hormone (PTH)  - Calcium  - Phosphorus Level  - Albumin  - Vitamin D 25 Hydroxy (25OH D2 + D3)  - N-telopeptide, urine  - Bone Alkaline Phosphatase  - Creatinine    ?? She did not recall previous discussion by Dr. Atha Starks about anabolic therapy, but is amenable to starting if indicated.  Reviewed in detail differences between anabolic therapy and bisphosphonate, good data for both for fracture risk reduction, different mechanisms, and importance of close follow-up and monitoring regardless.  ?? Reiterated importance of good dental care, safe weightbearing activities, falls precautions, and other general bone health measures reviewed at her initial endocrine visit.    2. Vitamin D deficiency  ?? Repeat level today and adjust dose as indicated, noting that her cirrhosis and possible D binding protein deficiency may confound our interpretation of levels.    3. T2DM, managed by PCP, cont same per pt preference. Currently on once daily 70/30 insulin as needed per her report, no lows.       Since Last Visit/History of Present Illness:   Holly Hanna is a 59 y.o. year old female w/hx of osteoporosis w/vertebral, rib, and wrist fractures; vitamin D deficiency; and T2DM  who presents to clinic today to discuss medication options to treat osteoporosis. Of note, cost barriers are a key consideration considering Tymlos w/o insurance coverage is extremely pricey.  Risk factors for fracture include: age, sex, post menopausal, low vitamin D, alcohol use and tobacco use.    [name] is doing well today but wants to figure out the plan regarding her osteoporosis tx. Pt says there is an upcoming dental surgery to get the rest of her teeth removed and will get dentures (already has top teeth all removed w/dentures placed there). Has  had previous dental surgeries. Has not been on any previous osteoporosis medication. Has not had any recent falls or fractures.    Past Medical History:    Active Ambulatory Problems     Diagnosis Date Noted   ??? Cirrhosis of liver with ascites (CMS-HCC) 07/30/2019   ??? Anemia 07/30/2019   ??? History of intravenous drug abuse (CMS-HCC) 07/30/2019   ??? Alcoholism /alcohol abuse (CMS-HCC) 07/30/2019   ??? Portal hypertension (CMS-HCC) 08/25/2019   ??? Hepatitis C 08/25/2019   ??? Diabetes (CMS-HCC) 10/02/2019   ??? Hyponatremia 10/02/2019   ??? UTI (urinary tract infection) 10/05/2019   ??? Rectal bleeding 10/18/2019   ??? Encephalopathy acute 04/01/2020   ??? Hemorrhoids 04/20/2020   ??? AKI (acute kidney injury) (CMS-HCC) 04/20/2020   ??? Tobacco use disorder 04/21/2020     Resolved Ambulatory Problems     Diagnosis Date Noted   ??? No Resolved Ambulatory Problems     Past Medical History:   Diagnosis Date   ??? Gestational diabetes        Social History:   reports that she has been smoking cigarettes. She started smoking about 44 years ago. She has a 21.50 pack-year smoking history. She has never used smokeless tobacco.    Medications:  Medications reviewed in EPIC medication station and updated today by the clinical pharmacist practitioner.      Current Outpatient Medications:   ???  abaloparatide (TYMLOS) 80 mcg (3,120 mcg/1.56 mL) PnIj, Inject 0.47mL (80 mcg) under the skin daily., Disp: 1.56 mL, Rfl: 3  ???  diclofenac sodium (VOLTAREN) 1 % gel, Apply 2 g topically Four (4) times a day., Disp: 100 g, Rfl: 0  ???  folic acid (FOLVITE) 1 MG tablet, Take 1 tablet (1 mg total) by mouth daily., Disp: 90 tablet, Rfl: 3  ???  furosemide (LASIX) 40 MG tablet, Take 1 tablet (40 mg total) by mouth daily., Disp: 30 tablet, Rfl: 11  ???  insulin NPH-insulin regular, 70/30, (HUMULIN/NOVOLIN) 100 unit/mL (70-30) injection, Inject 10 Units under the skin Two (2) times a day. Plus Sliding Scale, Disp: , Rfl:   ???  lactulose (CHRONULAC) 10 gram/15 mL solution, Take (30 G) by mouth 2-3 times a day titrated to 3-4 bowel movements daily., Disp: 1800 mL, Rfl: 0  ???  omeprazole (PRILOSEC) 40 MG capsule, Take 1 capsule (40 mg total) by mouth daily., Disp: 90 capsule, Rfl: 3  ???  rifAXIMin (XIFAXAN) 550 mg Tab, Take 1 tablet (550 mg total) by mouth Two (2) times a day., Disp: 60 tablet, Rfl: 11  ???  sofosbuvir-velpatasvir (EPCLUSA) 400-100 mg tablet, Take 1 tablet by mouth daily., Disp: 28 tablet, Rfl: 5  ???  spironolactone (ALDACTONE) 100 MG tablet, Take 1 tablet (100 mg total) by mouth daily., Disp: 30 tablet, Rfl: 11  ???  thiamine (B-1) 100 MG tablet, Take 1 tablet (100 mg total) by mouth daily., Disp: 110 tablet, Rfl: 11    Objective:   Physical Examination:  Vitals:    Vitals:    07/04/20 1432   BP: 92/55   Pulse: 91   Temp: 36.8 ??C (98.2 ??F)   TempSrc: Oral   Weight: 71.2 kg (157 lb)   Height: 154.9 cm (5' 0.98)     General:  Well appearing in no acute distress.    Wt Readings from Last 3 Encounters:   07/04/20 71.2 kg (157 lb)   06/08/20 69.9 kg (154 lb)   05/26/20 70.8 kg (156 lb)

## 2020-07-05 ENCOUNTER — Ambulatory Visit: Admit: 2020-07-05 | Discharge: 2020-07-06 | Payer: MEDICAID

## 2020-07-07 MED ORDER — EMPTY CONTAINER
3 refills | 0 days
Start: 2020-07-07 — End: ?

## 2020-07-07 MED ORDER — PEN NEEDLE, DIABETIC 31 GAUGE X 3/16" (5 MM)
3 refills | 0 days
Start: 2020-07-07 — End: ?

## 2020-07-07 NOTE — Unmapped (Signed)
The patient declined counseling on medication administration, missed dose instructions, goals of therapy, side effects and monitoring parameters, warnings and precautions and drug/food interactions because they were counseled in clinic. The information in the declined sections below are for informational purposes only and was not discussed with patient.       [facility]   Patient Onboarding/Medication Counseling    Holly Hanna is a 59 y.o. female with osteoporosis who I am counseling today on initiation of therapy.  I am speaking to the patient and her daughter.    Was a Nurse, learning disability used for this call? No    Verified patient's date of birth / HIPAA.    Specialty medication(s) to be sent: General Specialty: Tymlos      Non-specialty medications/supplies to be sent: pen needles, sharps      Medications not needed at this time: none         Tymlos (Abaloparatide)    Medication & Administration     Dosage: Inject 80 mcg under the skin daily.    ??? **Requires pen needles: Pen needles not provided with Tymlos pen. Use 31 gauge; 5-8 mm pen needles with Tymlos pen.    Administration:   1. Check Tymlos pen for expiration date and damages  2. Wash hands soap and warm water  3. Pull pen cap off Tymlos and check cartridge ??? liquid should be clear, colorless, and free of particles  4. Pull of protective paper from the outer needle cap of your pen needle.  5. Keep the needle straight and screw it onto the pen until fixed; a secure fit is ensured even if there is no noticeable stop. Do not over-tighten  6. Pull off the OUTER pen needle cap from the pen needle and keep it to use after your injection  7. Carefully pull off the INNER pen needle cap and dispose of it  8. Prime the pen for the day 1 first use only:  a. Turn the dose knob on your TYMLOS pen away from you (clockwise) until it stops. You will see ??? ? 80 ??? lined up in the dose display window  b. Hold the TYMLOS pen with the pen needle pointing up. Tap lightly with your finger on the cartridge holder to move any air bubbles in the cartridge to the top of the cartridge.  c. Press the green injection button until it will not go any further (You will see liquid come out of the needle tip and you will see ?0??? lined up in the dose display window) ??? if you do not see liquid come out then repeat steps a-c.  If still no liquid call pharmacist or use new pen.  9. Set the dose by turning the dose knob on your pen away from you (clockwise) until the dose knob stops and ????80??? is lined up in the dose display window (If not able to set dose to 80 then either not enough medication in pen or if new pen then use another pen)  10. Injections should be given in the lower stomach area (abdomen). Avoid the 2-inch area around your belly button (navel).  a. For each injection, change (rotate) your injection site around your abdomen.  b. Do not inject into areas where the skin is tender, bruised, red, scaly, or hard. Avoid areas with scars or stretch marks  11. Wipe the injection site with an alcohol swab and allow it to dry. Do not touch, fan, or blow on the injection  site after you have cleaned it.  12. Hold the pen so you can see the dose display window during the injection and insert the pen needle straight into your skin.  13. Press the green injection button until it cannot go any further and ????0??? is in the dose display window.  a. Do not move the TYMLOS pen after inserting the needle.  b. Continue to press the green injection button while counting to 10. Counting to 10 will allow the full dose of TYMLOS to be given  14. After counting to 10, release your finger from the green injection button and slowly remove the pen from the injection site by pulling the pen needle straight out.  15. Carefully place the outer needle cap back on the pen needle. Press on the outer needle cap until it snaps into place and is secure.  16. To prevent needle stick injury: Unscrew the capped needle (like unscrewing a cap from a bottle). To unscrew the capped needle you may need to turn it 8 or more turns and then gently pull until the capped needle comes off.   a. Do not push on the outer needle cap while unscrewing the needle. You should see a gap widening between the outer needle cap and the pen as you unscrew the needle.  17. Firmly replace the pen cap. Keep the pen cap on your TYMLOS pen between injections  18. Press a cotton ball or gauze pad over the injection site and hold for 10 seconds. Do not rub the injection site. You may have slight bleeding. This is normal. You may cover the injection site with a small adhesive bandage, if needed  19. Throw away your TYMLOS pen, in a sharps container, 30 days after first opening it even if it still contains unused medicine. Put your used pen needles in a sharps disposal container right away after use.    Adherence/Missed dose instructions:  ??? Take a missed dose as soon as you think about it on the same day you missed the dose.  ??? If you do not think about the missed dose until the next day, skip the missed dose and go back to your normal time.  ??? Do not take more than 1 dose of this drug in the same day    Goals of Therapy     Osteoporosis: Treatment of postmenopausal females with osteoporosis at high risk for fracture May also be used in patients who have failed or are intolerant to other available osteoporosis therapy    Side Effects & Monitoring Parameters     Common side effects:  ??? Injection site reaction: redness or irritation, swelling or pain  ??? Feeling dizzy, tired, or weak  ??? Headache  ??? Upset stomach or stomach pain    The following side effects should be reported to the provider:  ??? Allergic reaction  ??? Signs of high calcium levels, like weakness, confusion, feeling tired, vomiting, constipation, or bone pain  ??? Very bad dizziness or passing out  ??? Fast or abnormal heartbeat  ??? Muscle weakness  ??? Pain when passing urine  ??? Back pain, belly pain, or blood in urine (may be sings of a kidney stone)    Monitoring Parameters:  ??? Orthostatic hypotension  ??? Serum calcium  ??? Urinary calcium  ??? Bone mineral density (BMD) should be evaluated at baseline and ~1 to 2 years following initiation of therapy     Contraindications, Warnings, & Precautions     ???  Orthostatic hypotension: low blood pressure when you stand up from sitting down may occur within 4 hours of dosing.  Be careful when standing from sitting position  ??? Osteosarcoma: [US Boxed Warning]: In animal studies, abaloparatide has been associated with an increase in osteosarcoma; risk was dependent on both dose and duration. Avoid use in patients with an increased risk of osteosarcoma (including Paget disease, bone metastases or skeletal malignancies, hereditary disorders predisposing to osteosarcoma, unexplained elevation of alkaline phosphatase, prior external beam or implant radiation therapy involving the skeleton, or in patients with open epiphyses).    Drug/Food Interactions     ??? Medication list reviewed in Epic. The patient was instructed to inform the care team before taking any new medications or supplements. No drug interactions identified.     Storage, Handling Precautions, & Disposal     ??? Before first use, store refrigerated at 2??C to 8??C (36??F to 46??F)  ??? After first use, store for up to 30 days at room temperature (20??C to 25??C or 68??F to 97??F)  ??? Store with pen cap on but do not store with needle on.  ??? Discard Tymlos after 30 days of first opening (write down date of first use on pen/box).      Current Medications (including OTC/herbals), Comorbidities and Allergies     Current Outpatient Medications   Medication Sig Dispense Refill   ??? abaloparatide (TYMLOS) 80 mcg (3,120 mcg/1.56 mL) PnIj Inject 0.89mL (80 mcg) under the skin daily. 1.56 mL 3   ??? diclofenac sodium (VOLTAREN) 1 % gel Apply 2 g topically Four (4) times a day. 100 g 0   ??? folic acid (FOLVITE) 1 MG tablet Take 1 tablet (1 mg total) by mouth daily. 90 tablet 3   ??? furosemide (LASIX) 40 MG tablet Take 1 tablet (40 mg total) by mouth daily. 30 tablet 11   ??? insulin NPH-insulin regular, 70/30, (HUMULIN/NOVOLIN) 100 unit/mL (70-30) injection Inject 10 Units under the skin Two (2) times a day. Plus Sliding Scale     ??? lactulose (CHRONULAC) 10 gram/15 mL solution Take (30 G) by mouth 2-3 times a day titrated to 3-4 bowel movements daily. 1800 mL 0   ??? omeprazole (PRILOSEC) 40 MG capsule Take 1 capsule (40 mg total) by mouth daily. 90 capsule 3   ??? rifAXIMin (XIFAXAN) 550 mg Tab Take 1 tablet (550 mg total) by mouth Two (2) times a day. 60 tablet 11   ??? sofosbuvir-velpatasvir (EPCLUSA) 400-100 mg tablet Take 1 tablet by mouth daily. 28 tablet 5   ??? spironolactone (ALDACTONE) 100 MG tablet Take 1 tablet (100 mg total) by mouth daily. 30 tablet 11   ??? thiamine (B-1) 100 MG tablet Take 1 tablet (100 mg total) by mouth daily. 110 tablet 11     No current facility-administered medications for this visit.       Allergies   Allergen Reactions   ??? Demerol [Meperidine] Hives   ??? Meperidine Hcl Hives   ??? Morphine Nausea And Vomiting and Hives       Patient Active Problem List   Diagnosis   ??? Cirrhosis of liver with ascites (CMS-HCC)   ??? Anemia   ??? History of intravenous drug abuse (CMS-HCC)   ??? Alcoholism /alcohol abuse (CMS-HCC)   ??? Portal hypertension (CMS-HCC)   ??? Hepatitis C   ??? Diabetes (CMS-HCC)   ??? Hyponatremia   ??? UTI (urinary tract infection)   ??? Rectal bleeding   ??? Encephalopathy acute   ???  Hemorrhoids   ??? AKI (acute kidney injury) (CMS-HCC)   ??? Tobacco use disorder       Reviewed and up to date in Epic.    Appropriateness of Therapy     Is medication and dose appropriate based on diagnosis? Yes    Prescription has been clinically reviewed: Yes    Baseline Quality of Life Assessment      How many days over the past month did your osteoporosis  keep you from your normal activities? For example, brushing your teeth or getting up in the morning. 0    Financial Information     Medication Assistance provided: Prior Authorization    Anticipated copay of $3 reviewed with patient. Verified delivery address.    Delivery Information     Scheduled delivery date: 07/08/20    Expected start date: 07/08/20    Medication will be delivered via Same Day Courier to the prescription address in [facility].  This shipment will not require a signature.      Explained the services we provide at [facility] and that each month we would call to set up refills.  Stressed importance of returning phone calls so that we could ensure they receive their medications in time each month.  Informed patient that we should be setting up refills 7-10 days prior to when they will run out of medication.  A pharmacist will reach out to perform a clinical assessment periodically.  Informed patient that a welcome packet and a drug information handout will be sent.      Patient verbalized understanding of the above information as well as how to contact the pharmacy at [phone] option 4 with any questions/concerns.  The pharmacy is open Monday through Friday 8:30am-4:30pm.  A pharmacist is available 24/7 via pager to answer any clinical questions they may have .    Patient Specific Needs     - Does the patient have any physical, cognitive, or cultural barriers? No    - Patient prefers to have medications discussed with  Patient     - Is the patient or caregiver able to read and understand education materials at a high school level or above? Yes    - Patient's primary language is  English     - Is the patient high risk? No     - Does the patient require a Care Management Plan? No     - Does the patient require physician intervention or other additional services (i.e. nutrition, smoking cessation, social work)? No      Arnold Long  [facility] Specialty Pharmacist

## 2020-07-07 NOTE — Unmapped (Signed)
[facility] Specialty Medication Onboarding    Specialty Medication: Tymlos  Prior Authorization: Approved   Financial Assistance: No - copay  <$25  Final Copay/Day Supply: $3 / 30 days    Insurance Restrictions: Yes - max 1 month supply     Notes to Pharmacist:     The triage team has completed the benefits investigation and has determined that the patient is able to fill this medication at [facility]. Please contact the patient to complete the onboarding or follow up with the prescribing physician as needed.

## 2020-07-08 NOTE — Unmapped (Signed)
I was the supervising physician in the delivery of the service. Fue Cervenka D Zahra Peffley, MD

## 2020-07-12 MED FILL — BD ULTRA-FINE MINI PEN NEEDLE 31 GAUGE X 3/16" (5 MM): 100 days supply | Qty: 100 | Fill #0

## 2020-07-12 MED FILL — TYMLOS 80 MCG/DOSE (3,120 MCG/1.56 ML) SUBCUTANEOUS PEN INJECTOR: 30 days supply | Qty: 2 | Fill #0 | Status: AC

## 2020-07-12 MED FILL — EMPTY CONTAINER: 120 days supply | Qty: 1 | Fill #0 | Status: AC

## 2020-07-12 MED FILL — EMPTY CONTAINER: 120 days supply | Qty: 1 | Fill #0

## 2020-07-12 MED FILL — BD ULTRA-FINE MINI PEN NEEDLE 31 GAUGE X 3/16" (5 MM): 100 days supply | Qty: 100 | Fill #0 | Status: AC

## 2020-07-27 NOTE — Unmapped (Signed)
[facility] Clinical Assessment & Refill Coordination Note    Holly Hanna, DOB: Aug 17, 1961  Phone: [phone] (home)     All above HIPAA information was verified with patient.     Was a Nurse, learning disability used for this call? No    Specialty Medication(s):   General Specialty: Tymlos     Current Outpatient Medications   Medication Sig Dispense Refill   ??? abaloparatide (TYMLOS) 80 mcg (3,120 mcg/1.56 mL) PnIj Inject 0.36mL (80 mcg) under the skin daily. 1.56 mL 3   ??? diclofenac sodium (VOLTAREN) 1 % gel Apply 2 g topically Four (4) times a day. 100 g 0   ??? empty container Misc Use as directed to dispose of injectable medications 1 each 3   ??? folic acid (FOLVITE) 1 MG tablet Take 1 tablet (1 mg total) by mouth daily. 90 tablet 3   ??? furosemide (LASIX) 40 MG tablet Take 1 tablet (40 mg total) by mouth daily. 30 tablet 11   ??? insulin NPH-insulin regular, 70/30, (HUMULIN/NOVOLIN) 100 unit/mL (70-30) injection Inject 10 Units under the skin Two (2) times a day. Plus Sliding Scale     ??? lactulose (CHRONULAC) 10 gram/15 mL solution Take (30 G) by mouth 2-3 times a day titrated to 3-4 bowel movements daily. 1800 mL 0   ??? omeprazole (PRILOSEC) 40 MG capsule Take 1 capsule (40 mg total) by mouth daily. 90 capsule 3   ??? pen needle, diabetic 31 gauge x 3/16 (5 mm) Ndle Use as directed with Tymlos injection 100 each 3   ??? rifAXIMin (XIFAXAN) 550 mg Tab Take 1 tablet (550 mg total) by mouth Two (2) times a day. 60 tablet 11   ??? sofosbuvir-velpatasvir (EPCLUSA) 400-100 mg tablet Take 1 tablet by mouth daily. 28 tablet 5   ??? spironolactone (ALDACTONE) 100 MG tablet Take 1 tablet (100 mg total) by mouth daily. 30 tablet 11   ??? thiamine (B-1) 100 MG tablet Take 1 tablet (100 mg total) by mouth daily. 110 tablet 11     No current facility-administered medications for this visit.        Changes to medications: [name] reports no changes at this time.    Allergies   Allergen Reactions   ??? Demerol [Meperidine] Hives   ??? Meperidine Hcl Hives   ??? Morphine Nausea And Vomiting and Hives       Changes to allergies: No    SPECIALTY MEDICATION ADHERENCE     Tymlos 3120 mcg/1.71ml: 15 days of medicine on hand       Medication Adherence    Patient reported X missed doses in the last month: 0  Specialty Medication: Tymlos  Patient is on additional specialty medications: No  Informant: patient          Specialty medication(s) dose(s) confirmed: Regimen is correct and unchanged.     Are there any concerns with adherence? No    Adherence counseling provided? Not needed    CLINICAL MANAGEMENT AND INTERVENTION      Clinical Benefit Assessment:    Do you feel the medicine is effective or helping your condition? Unable to determine at this time    Clinical Benefit counseling provided? Not needed    Adverse Effects Assessment:    Are you experiencing any side effects? No    Are you experiencing difficulty administering your medicine? No    Quality of Life Assessment:    How many days over the past month did your osteoporosis  keep you from your normal activities? For example, brushing your teeth or getting up in the morning. 0    Have you discussed this with your provider? Not needed    Therapy Appropriateness:    Is therapy appropriate? Yes, therapy is appropriate and should be continued    DISEASE/MEDICATION-SPECIFIC INFORMATION      For patients on injectable medications: Patient currently has ~14 doses left.  Next injection is scheduled for 07/28/20 (injected daily).    PATIENT SPECIFIC NEEDS     - Does the patient have any physical, cognitive, or cultural barriers? No    - Is the patient high risk? No    - Does the patient require a Care Management Plan? No     - Does the patient require physician intervention or other additional services (i.e. nutrition, smoking cessation, social work)? No      SHIPPING     Specialty Medication(s) to be Shipped:   General Specialty: Tymlos    Other medication(s) to be shipped: No additional medications requested for fill at this time     Changes to insurance: No    Delivery Scheduled: Yes, Expected medication delivery date: 08/02/20.     Medication will be delivered via Same Day Courier to the confirmed prescription address in [facility].    The patient will receive a drug information handout for each medication shipped and additional FDA Medication Guides as required.  Verified that patient has previously received a Conservation officer, historic buildings.    All of the patient's questions and concerns have been addressed.    Arnold Long   [facility] Specialty Pharmacist

## 2020-07-28 DIAGNOSIS — R188 Other ascites: Principal | ICD-10-CM

## 2020-07-28 DIAGNOSIS — Z01812 Encounter for preprocedural laboratory examination: Principal | ICD-10-CM

## 2020-07-28 DIAGNOSIS — K7460 Unspecified cirrhosis of liver: Principal | ICD-10-CM

## 2020-07-28 NOTE — Unmapped (Signed)
Patient needs current labs (less that 25 days old) prior to having dental surgery. Orders placed and patient will go to [facility] facility to get tham drawn.

## 2020-08-02 MED FILL — TYMLOS 80 MCG/DOSE (3,120 MCG/1.56 ML) SUBCUTANEOUS PEN INJECTOR: SUBCUTANEOUS | 30 days supply | Qty: 1.56 | Fill #1

## 2020-08-02 MED FILL — TYMLOS 80 MCG/DOSE (3,120 MCG/1.56 ML) SUBCUTANEOUS PEN INJECTOR: 30 days supply | Qty: 2 | Fill #1 | Status: AC

## 2020-08-04 NOTE — Unmapped (Addendum)
Follow-Up Counseling for HCV Treatment    Regimen: Epclusa (sofosbuvir/velpatasvir 400/100mg ) x 24 weeks x 24 weeks  Start Date: 11/27/19  Completion date: 05/20/20 (missed doses due to hospitalizations)   Post-TW# 11  Fibrosis: F4    Pharmacy: [facility] [phone]    Following topics were reviewed during the phone call:    1. Any medical changes or hospitalizations since completing treatment? No.     2. Any changes to current medications? Yes, pt reports recently starting abaloparatide (Tymlos) 80 mcg (3,120 mcg/1.56 mL) pen and rifaximin 550 mg PO BID. Pt reports still experiencing difficulty with memory.     3. Any alcohol use? No alcohol use, congratulated pt on abstinence.    4. Follow up - No P-TW#12 appointment scheduled in HCV treatment clinic. Provided pt with GI scheduling phone number [phone] option 1, option 1 to schedule P-TW#12 appointment at earliest convenience for around 08/12/20. Stressed the importance of follow up 3 months post treatment to assess for cure. Informed patient that there is still a small chance of relapse after finishing the treatment. We discussed in the rare event that post-TW#12 lab results indicated relapse, pt will need to be re-treated. We also discussed the importance of continued cirrhosis care and importance of ongoing surveillance for [facility] [place]. 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       5. Any barriers to coming to Post-TW#12 appointment? None reported. Pt stated that daughter can help take her to appointments.     All questions were answered.    Arn Medal, PharmD Candidate  August 04, 2020 2:17 PM    I reviewed the findings with Pharmacy Student and agree with the findings and counseling as documented in the note. Appt scheduled with Dr. Camila Li on 10/10/20.    Park Breed, Pharm D., BCPS, BCGP, CPP  [facility]  [address]  [place], [place] [address]  [phone]

## 2020-09-13 ENCOUNTER — Ambulatory Visit: Admit: 2020-09-13 | Discharge: 2020-09-14 | Payer: MEDICAID | Attending: Internal Medicine | Primary: Internal Medicine

## 2020-09-13 DIAGNOSIS — Z5181 Encounter for therapeutic drug level monitoring: Principal | ICD-10-CM

## 2020-09-13 DIAGNOSIS — E559 Vitamin D deficiency, unspecified: Principal | ICD-10-CM

## 2020-09-13 DIAGNOSIS — M8000XA Age-related osteoporosis with current pathological fracture, unspecified site, initial encounter for fracture: Principal | ICD-10-CM

## 2020-09-13 LAB — PHOSPHORUS: Phosphate:MCnc:Pt:Ser/Plas:Qn:: 3.6

## 2020-09-13 LAB — ALBUMIN: Albumin:MCnc:Pt:Ser/Plas:Qn:: 2.5 — ABNORMAL LOW

## 2020-09-13 LAB — CALCIUM: Calcium:MCnc:Pt:Ser/Plas:Qn:: 9

## 2020-09-13 NOTE — Unmapped (Signed)
[facility] Refill Coordination Note    Specialty Medication(s) to be Shipped:   General Specialty: Tymlos    Other medication(s) to be shipped: No additional medications requested for fill at this time     Holly Hanna, DOB: [birth year]  Phone: [phone] (home)       All above HIPAA information was verified with patient.     Was a Nurse, learning disability used for this call? No    Completed refill call assessment today to schedule patient's medication shipment from the [facility] [phone]).       Specialty medication(s) and dose(s) confirmed: Regimen is correct and unchanged.   Changes to medications: Mirra reports no changes at this time.  Changes to insurance: No  Questions for the pharmacist: No    Confirmed patient received Welcome Packet with first shipment. The patient will receive a drug information handout for each medication shipped and additional FDA Medication Guides as required.       DISEASE/MEDICATION-SPECIFIC INFORMATION        For patients on injectable medications: Patient currently has AT LEAST 5 doses left.  Next injection is scheduled for 10/6, DAILY .    SPECIALTY MEDICATION ADHERENCE     Medication Adherence    Patient reported X missed doses in the last month: 0  Specialty Medication: TYMLOS 80 MCG   Informant: patient  Confirmed plan for next specialty medication refill: delivery by pharmacy  Refills needed for supportive medications: not needed          Refill Coordination    Has the Patients' Contact Information Changed: No  Is the Shipping Address Different: No           TYMLOS 80 MCG: 5 days of medicine on hand         SHIPPING     Shipping address confirmed in Epic.     Delivery Scheduled: Yes, Expected medication delivery date: 10/8.     Medication will be delivered via Same Day Courier to the prescription address in Epic WAM.    Jolene Schimke   [facility] Specialty Technician

## 2020-09-13 NOTE — Unmapped (Signed)
Assessment and Plan:  #Osteoporosis, with fragility fracture of thoracic spine: Currently 1 month into therapy with abaloparatide.  No significant adverse side effects.  --We will check calcium and vitamin D today  --Plan for 2 years therapy  --Recommend vitamin D 1000 IU daily  -- Osteoporosis risk factors as detailed in HPI.    #Hepatic Cirrhosis    Follow-up in about 6 months.    The patient was seen and discussed with Dr. Maple Hudson who was available.    --  Glean Hess, MD  Endocrinology Fellow  [facility]  Phone: [phone]   Fax: [phone]    ----------------------------------------------------------------------------------------  History of Presenting Illness:  Holly Hanna is a 59 y.o. female who is seen in consultation for osteoporosis at the request of Mcarthur Rossetti, PAC.  Interval History:  Patient saw Dr. Lyda Perone (endocrine attending) this past summer.  The patient had not started abaloparatide due to issues with obtaining and not understanding the process.  In working with clinical pharmacy, the patient was able to obtain. She started the medication 1 month ago. She feels some cramping after injection that is very transient. No other issues.     Initial History:  Patient is a pleasant 59 year old female who presents for evaluation of osteoporosis with pathologic fracture of the T9 vertebrae.  Vertebral fracture was noted incidentally on imaging obtained for other purposes completed and December 2020 and was new since prior imaging had been obtained and September 2020.  It was also noted that she had several old healed rib fractures in the absence of any trauma to explain any of the above-mentioned fractures.  A DEXA scan was obtained and showed osteoporosis as follows:  The bone mineral density in the spine measuring L1 to 3 measures 0.676 gm/cm2.  The  Z score is -2.0 and the T score is -3.1.  This value is below the fracture risk threshold.  ??  The total bone mineral density in the proximal left femur measures 0.701 gm/cm2.  The Z score is -1.2 and the T score is -2.0.  This value is at the fracture risk threshold.  The femoral neck density is 0.549 gm/cm2, and the T score is -2.7.  The other T scores range from -1.8 to -1.7.     Other fractures history: ribs, left wrist    DEXA history: 2021    Prior osteoporosis treatment: n/a    Menopause occurred at age early 30's.  She did not receive HRT.      Risk factors for fracture include: age, sex, post menopausal, low vitamin D, alcohol use and tobacco use.    Vitamin D replacement: No.  Hx of kidney stones: No.  Hx of height loss: No.        A 10 system ROS was completed and is negative other than that in HPI.    Past Medical History:   Diagnosis Date   ??? Gestational diabetes       Past Surgical History:   Procedure Laterality Date   ??? PR COLONOSCOPY W/BIOPSY SINGLE/MULTIPLE N/A 10/20/2019    Procedure: COLONOSCOPY, FLEXIBLE, PROXIMAL TO SPLENIC FLEXURE; WITH BIOPSY, SINGLE OR MULTIPLE;  Surgeon: Monte Fantasia, MD;  Location: [facility];  Service: Gastroenterology   ??? PR DESTRUCT INTERNAL HEMORRHOID, THERMAL  10/20/2019    Procedure: DESTRUCTION OF INTERNAL HEMORRHOID(S) BY THERMAL ENERGY;  Surgeon: Monte Fantasia, MD;  [facility]: [facility]  Service: Gastroenterology   ??? PR UPPER  GI ENDOSCOPY,DIAGNOSIS N/A 08/25/2019    Procedure: UGI ENDO, INCLUDE ESOPHAGUS, STOMACH, & DUODENUM &/OR JEJUNUM; DX W/WO COLLECTION SPECIMN, BY BRUSH OR WASH;  Surgeon: Andrey Farmer, MD;  Location: [facility];  Service: Gastroenterology   ??? TUBAL LIGATION          Current Outpatient Medications:   ???  abaloparatide (TYMLOS) 80 mcg (3,120 mcg/1.56 mL) PnIj, Inject 0.32mL (80 mcg) under the skin daily., Disp: 1.56 mL, Rfl: 3  ???  diclofenac sodium (VOLTAREN) 1 % gel, Apply 2 g topically Four (4) times a day., Disp: 100 g, Rfl: 0  ???  empty container Misc, Use as directed to dispose of injectable medications, Disp: 1 each, Rfl: 3  ???  folic acid (FOLVITE) 1 MG tablet, Take 1 tablet (1 mg total) by mouth daily., Disp: 90 tablet, Rfl: 3  ???  furosemide (LASIX) 40 MG tablet, Take 1 tablet (40 mg total) by mouth daily., Disp: 30 tablet, Rfl: 11  ???  insulin NPH-insulin regular, 70/30, (HUMULIN/NOVOLIN) 100 unit/mL (70-30) injection, Inject 10 Units under the skin Two (2) times a day. Plus Sliding Scale, Disp: , Rfl:   ???  lactulose (CHRONULAC) 10 gram/15 mL solution, Take (30 G) by mouth 2-3 times a day titrated to 3-4 bowel movements daily., Disp: 1800 mL, Rfl: 0  ???  omeprazole (PRILOSEC) 40 MG capsule, Take 1 capsule (40 mg total) by mouth daily., Disp: 90 capsule, Rfl: 3  ???  pen needle, diabetic 31 gauge x 3/16 (5 mm) Ndle, Use as directed with Tymlos injection, Disp: 100 each, Rfl: 3  ???  rifAXIMin (XIFAXAN) 550 mg Tab, Take 1 tablet (550 mg total) by mouth Two (2) times a day., Disp: 60 tablet, Rfl: 11  ???  sofosbuvir-velpatasvir (EPCLUSA) 400-100 mg tablet, Take 1 tablet by mouth daily., Disp: 28 tablet, Rfl: 5  ???  spironolactone (ALDACTONE) 100 MG tablet, Take 1 tablet (100 mg total) by mouth daily., Disp: 30 tablet, Rfl: 11  ???  thiamine (B-1) 100 MG tablet, Take 1 tablet (100 mg total) by mouth daily., Disp: 110 tablet, Rfl: 11      Family History   Problem Relation Age of Onset   ??? Kidney disease Mother    ??? Testicular cancer Father    ??? Liver disease Maternal Grandmother         Physical Exam  BP 117/62  - Pulse 89  - Wt 71.2 kg (157 lb)  - LMP  (LMP Unknown)  - BMI 29.68 kg/m??   GENERAL APPEARANCE: Well developed, well nourished, alert and cooperative, and appears to be in no acute distress.  HEAD: normocephalic.  EYES: PERRL, EOMI. Fundi normal, vision is grossly intact.  THROAT: Edentulous on top, poor dentition with evidence of gum disease on the lower oral cavity.  NECK: Neck supple, non-tender without lymphadenopathy, masses or thyromegaly.  CARDIAC: Normal S1 and S2. No S3, S4 or murmurs. Rhythm is regular. There is no peripheral edema, cyanosis or pallor. Extremities are warm and well perfused. Capillary refill is less than 2 seconds. No carotid bruits.  LUNGS: Clear to auscultation and percussion without rales, rhonchi, wheezing or diminished breath sounds.  EXTREMITIES/FEET: No significant deformity or joint abnormality. No edema. Peripheral pulses intact.   NEUROLOGICAL: Grossly normal    Relevant labs and studies not mentioned above:    Labs: I personally reviewed 01/29/2020    01/20/2020  Ca 8.3  PTH 81    01/28/2019  Ca 8.4 (corrected  9.4)  Alb 2.8   Phos 3.9  Alk Phos 365    Lab Results   Component Value Date    CREATININE 0.84 (H) 06/08/2020     Calcium   Date Value Ref Range Status   09/13/2020 9.0 8.7 - 10.4 mg/dL Final   16/09/9603 9.0 8.5 - 10.2 mg/dL Final   54/08/8118 9.2 8.7 - 10.4 mg/dL Final   14/78/2956 9.1 8.7 - 10.4 mg/dL Final   21/30/8657 9.3 8.5 - 10.2 mg/dL Final   84/69/6295 7.9 (L) 8.5 - 10.2 mg/dL Final   28/41/3244 7.9 (L) 8.5 - 10.2 mg/dL Final   12/12/7251 8.5 8.5 - 10.2 mg/dL Final     Vitamin D Total (25OH)   Date Value Ref Range Status   06/08/2020 24.4 20.0 - 80.0 ng/mL Final   01/29/2020 15.8 (L) 20.0 - 80.0 ng/mL Final   09/22/2019 17.0 (L) 20.0 - 80.0 ng/mL Final     Phosphorus   Date Value Ref Range Status   09/13/2020 3.6 2.4 - 5.1 mg/dL Final   66/44/0347 3.4 2.4 - 5.1 mg/dL Final   42/59/5638 3.9 2.9 - 4.7 mg/dL Final     Magnesium   Date Value Ref Range Status   04/21/2020 1.5 (L) 1.6 - 2.2 mg/dL Final   75/64/3329 2.0 1.6 - 2.2 mg/dL Final   51/88/4166 2.1 1.6 - 2.2 mg/dL Final     Imaging: I personally reviewed 09/13/20    DXA Review:  Not sure if there is issue with L4. On prior CT abdomen from 08/2019 it appears normal but question to me of compression of DXA (no lateral films).     Date Lumbar Spine (L1-3) Femoral Neck Total Hip   01/20/2020 -3.1 -2.7 -2.0                 CT Chest 11/23/2019: I personally reviewed images. Acute compression deformity of T9 body with at least 50% height loss.             Reviewed and summarized records in EPIC x 2 year(s) for purposes of today's visit.       -------------------------------------------------------------------------------------------------------------------------------

## 2020-09-15 LAB — VITAMIN D, TOTAL (25OH): Lab: 29.3

## 2020-09-16 MED FILL — TYMLOS 80 MCG/DOSE (3,120 MCG/1.56 ML) SUBCUTANEOUS PEN INJECTOR: 30 days supply | Qty: 2 | Fill #2 | Status: AC

## 2020-09-16 MED FILL — TYMLOS 80 MCG/DOSE (3,120 MCG/1.56 ML) SUBCUTANEOUS PEN INJECTOR: SUBCUTANEOUS | 30 days supply | Qty: 1.56 | Fill #2

## 2020-09-21 NOTE — Unmapped (Signed)
I saw and evaluated the patient, participating in the key portions of the service.  I reviewed the resident/fellow’s note.  I agree with the resident/fellow’s findings and plan.   Katasha Riga Anne Jamicheal Heard, MD, PhD  Associate Professor of Medicine  Division of Endocrinology  [phone]

## 2020-10-06 NOTE — Unmapped (Signed)
[facility] Refill Coordination Note    Specialty Medication(s) to be Shipped:   General Specialty: Tymlos    Other medication(s) to be shipped: needles     Holly Hanna, DOB: 1961-06-29  Phone: [phone] (home)       All above HIPAA information was verified with patient's family member, son.     Was a Nurse, learning disability used for this call? No    Completed refill call assessment today to schedule patient's medication shipment from the [facility] [phone]).       Specialty medication(s) and dose(s) confirmed: Regimen is correct and unchanged.   Changes to medications: Laken reports no changes at this time.  Changes to insurance: No  Questions for the pharmacist: No    Confirmed patient received Welcome Packet with first shipment. The patient will receive a drug information handout for each medication shipped and additional FDA Medication Guides as required.       DISEASE/MEDICATION-SPECIFIC INFORMATION        For patients on injectable medications: Patient currently has 10 doses left.  Next injection is scheduled for daily.    SPECIALTY MEDICATION ADHERENCE     Medication Adherence    Patient reported X missed doses in the last month: 0  Specialty Medication: tymlos  Patient is on additional specialty medications: No  Informant: child/children  Confirmed plan for next specialty medication refill: delivery by pharmacy  Refills needed for supportive medications: not needed          Refill Coordination    Has the Patients' Contact Information Changed: No  Is the Shipping Address Different: No           tymlos 80 mcg: 10 days of medicine on hand         SHIPPING     Shipping address confirmed in Epic.     Delivery Scheduled: Yes, Expected medication delivery date: 11/5.     Medication will be delivered via Same Day Courier to the prescription address in Epic WAM.    Jolene Schimke   [facility] Specialty Technician

## 2020-10-08 NOTE — Unmapped (Unsigned)
[name] [facility] FOLLOW-UP CLINIC VISIT    REFERRING PROVIDER: Pamelia Hoit, MD  [address] 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 Regarding her cirrhosis, her HE and ascites are well-controlled at this time. She is up-to-date with variceal screening and HCC surveillance. There were prior concerns regarding transplant candidacy including lack of health insurance, adequate social support, and need for alcohol counseling. However, she has [employer] (though they need to confirm they have full benefits) and daughter (primary caretaker) accompanies patient today and ensures the patient will have adequate support and rides to all necessary appointments. The patient stopped drinking upon diagnosis in 07/2019, which was her first alcohol-related medical complication/diagnosis. She has inquired with several local counselors, who have stated that the patient will not be able to get counseling covered by Medicaid due to her low risk for relapse. I will discuss with our transplant team.     1. Cirrhosis due to EtOH and HCV: MELD-Na 23 in 04/2020  - Update MELD labs today  - Continue lasix 40 mg and spironolactone 100 mg daily  - Continue lactulose dosing and titrate to 2-3 BMs per day; add rifaximin due to intermittent mild confusion  - Low salt high protein diet with bedtime snack  - EGD on 08/25/19 with grade 1 EV; repeat due 08/2020 (order placed)  - No liver lesions on 06/2020 MRI; also showing chronic left portal vein thrombus; next imaging with liver U/S  12/2020    2. Chronic alcohol use: has been abstinent since diagnosis in 07/2019  - Discussed importance of continued abstinence  - Will discuss case with transplant psychology due to patient's report of inability to get counseling   -ASAP??  -ETG today    3. Chronic hepatitis C: genotype 1b with VL 15,637 on 08/23/19 s/p Epclusa  - Completed Epclusa x 24 weeks in 04/2020, VL: ND on  05/26/20  - 6 mo today recheck HCV RNA in 08/2020 to assess for SVR    4. Hemorrhoids complicated by bleeding:  - Endoscopic treatment with HET ordered during recent hospitalization  - Patient to call and schedule if with recurrent bleeding from hemorrhoids; would like to hold off for now    5. GERD:   - Continue omeprazole 20 mg daily    6. Pulmonary nodule: seen on chest CT 08/2019 with stability in 11/2019 and 05/2020  - Repeat chest CT recommended for 6 months; due 11/2020    7. Compression fracture / osteoporosis: new T5 compression fracture seen on 05/16/20 chest CT  - Follows with Endocrinology  - Continue high dose vitamin D supplementation, dietary calcium (patient to pick up vit D at outpatient pharmacy today)  - Awaiting approval of abaloparatide; unclear the status of this and will notify Endocrinologist re: recent new compression fracture    8. Vaccinations: We recommend that patients have vaccinations to prevent various infections that can occur, especially in the setting of having underlying liver disease. The following vaccinations should be given:  - Hepatitis A: immune 07/30/19  - Hepatitis B: immune from prior infection 07/30/19  - Influenza (yearly): given 09/22/19; next due fall 2021  - Pneumococcal: PPSV23 given  09/22/19; Prevnar due age 33 years  - Zoster: Shingrix two doses given 2-6 months apart due; defer to PCP  - SARS-CoV-2: encouraged patient to get the vaccine when eligible    9. Health maintenance:  - Overdue for mammogram and pap smear, which can be completed with her PCP  - Up-to-date with colorectal cancer screening; no precancerous polyps on 10/2019 colonoscopy and repeat due 10/2029    RV: 46mo  Pt was discussed with and seen by Dr. Marland Kitchen who is in agreement with this plan.   Siri Cole MD,Ph.D.   Transplant Hepatology Fellow  [phone]          SUBJECTIVE:     REASON FOR VISIT: follow-up alcohol-related cirrhosis    HISTORY OF PRESENT ILLNESS:     Holly Hanna is a 59 y.o. female with polysubstance use and cirrhosis secondary to chronic alcohol use and HCV (genotype 1b, s/p Epclusa 11/27/19 x 24wks), complicated by ascites and hepatic encephalopathy. Additional comorbid disease includes but not limited to diabetes and polypsubstance abuse history (ETOH &  IVDU). She first presented to [facility] on 07/[facility]/19 with abdominal distension and was found to have steroid non-responsive acute alcohol-related hepatitis and cirrhosis with portal hypertension and large volume ascites. She has abstained from alcohol since her diagnosis in 07/2019. Transplant candidacy is limited by lack of alcohol relapse prevention counseling; she now has Medicaid. Last seen by Dr.  Selena Batten on 05/2020.     INTERIM HISTORY:  The patient completed a 24-week course of Epclusa in 05/2020 (start date 11/27/19). She did miss several doses while in and out of the hospital but took her last dose about a week ago. She remains on lasix 40 mg and spironolactone 100 mg daily and denies abdominal distension. She reports some episodes of mild confusion with tremors and forgetfulness despite being adherent to lactulose and having 3-5 BMs per day. She has remained abstinent of alcohol since 07/2019 but has not established care with an alcohol use counselor. She has inquired with several local counselors but has been told that Medicaid will not cover counseling due to her low risk of relapse and time from last drink being near 1 year. Denies jaundice, nausea, vomiting, constipation, diarrhea, hematemesis, melena, or hematochezia. No more issues related to hemorrhoids recently.     ETOH sobriety since 07/2019. Smokes <1/2 ppd but was previously smoking 2-3 ppd since age 28 years. She denies any drug use for the past 6 years after undergoing inpatient treatment for her addiction. Occasionally uses marijuana once weekly for management of chronic pain. L        MEDICATIONS:  Current Outpatient Medications   Medication Sig Dispense Refill   ??? abaloparatide (TYMLOS) 80 mcg (3,120 mcg/1.56 mL) PnIj Inject 0.7mL (80 mcg) under the skin daily. 1.56 mL 3   ??? diclofenac sodium (VOLTAREN) 1 % gel Apply 2 g topically Four (4) times a day. 100 g 0   ??? empty container Misc Use as directed to dispose of injectable medications 1 each 3   ??? furosemide (LASIX) 40 MG tablet Take 1 tablet (40 mg total) by mouth daily. 30 tablet 11   ??? insulin NPH-insulin regular, 70/30, (HUMULIN/NOVOLIN) 100 unit/mL (70-30) injection Inject 10 Units under the skin Two (2) times a day. Plus Sliding Scale     ??? lactulose (CHRONULAC) 10 gram/15 mL solution Take (30 G) by mouth 2-3 times a day titrated to 3-4 bowel movements daily. 1800 mL 0   ??? pen needle, diabetic  31 gauge x 3/16 (5 mm) Ndle Use as directed with Tymlos injection 100 each 3   ??? rifAXIMin (XIFAXAN) 550 mg Tab Take 1 tablet (550 mg total) by mouth Two (2) times a day. 60 tablet 11   ??? sofosbuvir-velpatasvir (EPCLUSA) 400-100 mg tablet Take 1 tablet by mouth daily. 28 tablet 5   ??? spironolactone (ALDACTONE) 100 MG tablet Take 1 tablet (100 mg total) by mouth daily. 30 tablet 11   ??? thiamine (B-1) 100 MG tablet Take 1 tablet (100 mg total) by mouth daily. 110 tablet 11     No current facility-administered medications for this visit.       ALLERGIES:  Demerol [meperidine], Meperidine hcl, and Morphine        OBJECTIVE:     VITAL SIGNS: LMP  (LMP Unknown)       PHYSICAL EXAM:  Constitutional: well-appearing, in no acute distress  Cardiovascular: normal rate, regular rhythm, systolic flow murmur  Lungs: normal work of breathing, clear to auscultation with good air movement  Abdomen: soft, nontender, mildly distended  Extremities: warm and well-perfused, no distal edema  Neuro: alert and oriented, no focal deficits  Mental Status: linear thoughts, appropriate affect, not anxious-appearing    DIAGNOSTIC STUDIES:  I have reviewed all pertinent diagnostic studies, including:    GI Procedures:  08/25/19 EGD  - Grade I esophageal varices with no bleeding and no stigmata of recent bleeding.  - Portal hypertensive gastropathy.  - Gastric antral vascular ectasia without bleeding.  - Normal examined duodenum.  - No specimens collected.    10/20/19 Colonoscopy  - Non-thrombosed internal hemorrhoids found on perianal exam.  - One 3 mm polyp in the sigmoid colon, removed with a cold biopsy forceps. Resected and retrieved.  - The entire examined colon is otherwise normal on direct and retroflexed views.  - The examined portion of the ileum was normal.  - Non-bleeding internal hemorrhoids.  - Hemorrhoid treatment using the HET Bipolar Forceps performed. Non-thrombosed internal hemorrhoids found on perianal exam.    A: Colon, sigmoid, biopsy  - Colonic mucosa with focal mild epithelial hyperplasia (1 fragment)    Radiographic studies:  MRI 05/26/20  IMPRESSION:  No suspicious liver masses concerning for HCC.  ??  Sequelae of cirrhosis with likely portal hypertension including nodular shrunken liver with caudate lobe hypertrophy, small caliber gastrosplenic varices, splenomegaly, and recannulized paraumbilical vein.  ??  Branches of the left portal vein are not clearly seen possibly chronically occluded.  ??  Small volume perihepatic and perisplenic ascites, decreased from prior.    05/16/20 CT Chest Wo Contrast  - Pulmonary nodules, measuring up to 0.8 cm, unchanged. Recommend 6 month follow-up chest CT to document continued stability.  - Redemonstrated T8 compression fracture with new T5 compression fracture.   - Cirrhotic liver morphology with splenomegaly.  - Cholelithiasis.    02/11/20 US Abdomen Complete  - The liver is seen with moderate limitations (visualization score B), due to coarse liver parenchyma.    - LI-RADS ultrasound category: 1  - No suspicious hepatic lesions identified.  - Cirrhotic liver morphology with evidence of portal hypertension (splenomegaly, ascites, patent umbilical vein).  - Cholelithiasis.      Laboratory results:  Lab Results   Component Value Date    WBC 6.7 05/26/2020    HGB 10.7 (L) 05/26/2020    MCV 95.4 05/26/2020    PLT 56 (L) 05/26/2020    Lab Results   Component Value Date    NA  132 (L) 05/26/2020    K 4.3 05/26/2020    CL 102 05/26/2020    CREATININE 0.84 (H) 06/08/2020    GLU 199 (H) 05/26/2020    CALCIUM 9.0 09/13/2020      Lab Results   Component Value Date    ALBUMIN 2.5 (L) 09/13/2020    AST 50 (H) 05/26/2020    ALT 33 05/26/2020    ALKPHOS 278 (H) 05/26/2020    BILITOT 4.3 (H) 05/26/2020    Lab Results   Component Value Date    INR 1.56 05/26/2020        Lab Results   Component Value Date    IRON 155 04/20/2020    FERRITIN 430.0 (H) 04/20/2020     Lab Results   Component Value Date    CHOL 114 07/30/2019    TRIG 111 07/30/2019    A1C 4.8 04/02/2020     Lab Results   Component Value Date    VITAMINB12 979 (H) 08/23/2019    FOLATE 13.7 08/23/2019    VITDTOTAL 29.3 09/13/2020     Lab Results   Component Value Date    HEPAIGG Reactive (A) 07/30/2019    HBSAG Nonreactive 08/23/2019    HEPBSAB Reactive (A) 07/30/2019    HEPBCAB Reactive (A) 07/30/2019    HEPCAB Reactive (A) 08/23/2019    HIV Nonreactive 11/23/2019     Lab Results   Component Value Date    ANA Positive (A) 07/30/2019    SMOOTHMUSCAB Negative 07/30/2019    MITOAB Negative 07/30/2019       MELD-Na score: 21 at 05/26/2020 10:06 AM  Calculated from:  Serum Creatinine: 0.95 mg/dL (Using min of 1 mg/dL) at 1/61/0960 45:40 AM  Serum Sodium: 132 mmol/L at 05/26/2020 10:06 AM  Total Bilirubin: 4.3 mg/dL at 9/81/1914 78:29 AM  INR(ratio): 1.56 at 05/26/2020 10:06 AM  Age: 49 years

## 2020-10-10 DEATH — deceased

## 2020-10-14 MED FILL — BD ULTRA-FINE MINI PEN NEEDLE 31 GAUGE X 3/16" (5 MM): 100 days supply | Qty: 100 | Fill #1 | Status: AC

## 2020-10-14 MED FILL — TYMLOS 80 MCG/DOSE (3,120 MCG/1.56 ML) SUBCUTANEOUS PEN INJECTOR: 30 days supply | Qty: 2 | Fill #3 | Status: AC

## 2020-10-14 MED FILL — TYMLOS 80 MCG/DOSE (3,120 MCG/1.56 ML) SUBCUTANEOUS PEN INJECTOR: SUBCUTANEOUS | 30 days supply | Qty: 1.56 | Fill #3

## 2020-10-14 MED FILL — BD ULTRA-FINE MINI PEN NEEDLE 31 GAUGE X 3/16" (5 MM): 100 days supply | Qty: 100 | Fill #1

## 2020-11-08 DIAGNOSIS — M8000XA Age-related osteoporosis with current pathological fracture, unspecified site, initial encounter for fracture: Principal | ICD-10-CM

## 2020-11-08 MED ORDER — TYMLOS 80 MCG/DOSE (3,120 MCG/1.56 ML) SUBCUTANEOUS PEN INJECTOR
Freq: Every day | SUBCUTANEOUS | 3 refills | 30.00000 days | Status: CP
Start: 2020-11-08 — End: 2020-12-08

## 2020-11-15 NOTE — Unmapped (Signed)
The [facility] has made a SECOND and final attempt to reach this patient to refill the following medication:TYMLOS.      We have left voicemails on the following phone numbers: [phone], [phone] and have sent a MyChart message.    Dates contacted: 12/2  12/7   Last scheduled delivery: 11/5    The patient may be at risk of non-compliance with this medication. The patient should call the [facility] at [phone] (option 4) to refill medication.    Print production planner

## 2020-11-30 NOTE — Unmapped (Signed)
I called the patient's phone number which is on the chart and received an answer from a family member.  Purpose of the call was regarding missed refills of Tymlos.  She notified me that Holly Hanna had passed away.  I did not ask for specifics.  I gave my condolences.  We will make appropriate changes to her medical chart.

## 2020-12-29 NOTE — Unmapped (Signed)
This patient has been disenrolled from the [facility] specialty pharmacy services due to patient is deceased.    Arnold Long  [facility] Specialty Pharmacist
